# Patient Record
Sex: Male | Born: 1960 | Race: White | Marital: Married | Smoking: Never smoker
Health system: Southern US, Community
[De-identification: ages and names within clinical notes are randomized; demographics above are authoritative.]

## PROBLEM LIST (undated history)

## (undated) DIAGNOSIS — G43909 Migraine, unspecified, not intractable, without status migrainosus: Secondary | ICD-10-CM

## (undated) DIAGNOSIS — R51 Headache: Secondary | ICD-10-CM

## (undated) DIAGNOSIS — G4730 Sleep apnea, unspecified: Secondary | ICD-10-CM

## (undated) DIAGNOSIS — K219 Gastro-esophageal reflux disease without esophagitis: Secondary | ICD-10-CM

## (undated) DIAGNOSIS — M1990 Unspecified osteoarthritis, unspecified site: Secondary | ICD-10-CM

## (undated) DIAGNOSIS — E7800 Pure hypercholesterolemia, unspecified: Secondary | ICD-10-CM

## (undated) HISTORY — PX: TONSILLECTOMY: SUR1361

---

## 2002-03-19 HISTORY — PX: KNEE ARTHROSCOPY: SHX127

## 2010-01-18 HISTORY — PX: NASAL SEPTUM SURGERY: SHX37

## 2010-01-18 HISTORY — PX: ADENOIDECTOMY: SHX5191

## 2012-10-03 ENCOUNTER — Encounter (HOSPITAL_COMMUNITY): Payer: Self-pay | Admitting: *Deleted

## 2012-10-03 ENCOUNTER — Emergency Department (HOSPITAL_COMMUNITY)
Admission: EM | Admit: 2012-10-03 | Discharge: 2012-10-04 | Disposition: A | Discharge status: Home / Self Care | Attending: Emergency Medicine | Admitting: Emergency Medicine

## 2012-10-03 DIAGNOSIS — Z79899 Other long term (current) drug therapy: Secondary | ICD-10-CM | POA: Insufficient documentation

## 2012-10-03 DIAGNOSIS — Z8639 Personal history of other endocrine, nutritional and metabolic disease: Secondary | ICD-10-CM | POA: Insufficient documentation

## 2012-10-03 DIAGNOSIS — Z862 Personal history of diseases of the blood and blood-forming organs and certain disorders involving the immune mechanism: Secondary | ICD-10-CM | POA: Insufficient documentation

## 2012-10-03 DIAGNOSIS — H9202 Otalgia, left ear: Secondary | ICD-10-CM

## 2012-10-03 DIAGNOSIS — H9209 Otalgia, unspecified ear: Secondary | ICD-10-CM | POA: Insufficient documentation

## 2012-10-03 HISTORY — DX: Pure hypercholesterolemia, unspecified: E78.00

## 2012-10-03 NOTE — ED Notes (Signed)
Pt in c/o pain to left ear, states he has been seen for this already and dx with ear infection, states the symptoms are not getting any better, states he has been compliant with antibiotics

## 2012-10-03 NOTE — ED Notes (Signed)
ED PA at bedside

## 2012-10-04 MED ORDER — PSEUDOEPHEDRINE HCL ER 120 MG PO TB12
120.0000 mg | ORAL_TABLET | Freq: Once | ORAL | Status: AC
  Administered 2012-10-04: 120 mg via ORAL
  Filled 2012-10-04: qty 1

## 2012-10-04 MED ORDER — OXYCODONE-ACETAMINOPHEN 5-325 MG PO TABS: 1.0000 | ORAL_TABLET | ORAL | Status: DC | PRN

## 2012-10-04 NOTE — ED Provider Notes (Signed)
Medical screening examination/treatment/procedure(s) were performed by non-physician practitioner and as supervising physician I was immediately available for consultation/collaboration.   Dione Booze, MD 10/04/12 [phone]

## 2012-10-04 NOTE — ED Provider Notes (Signed)
CSN: [id number]     Arrival date & time 10/03/12  2114 History   First MD Initiated Contact with Patient 10/03/12 2151     Chief Complaint  Patient presents with  . Otalgia   (Consider location/radiation/quality/duration/timing/severity/associated sxs/prior Treatment) HPI History provided by pt.   Pt has had severe pain in L ear for the past 4 days.  Was evaluated at an urgent care the day after onset and was discharged home w/ clarithromycin, which he has been compliant with, and auralgan.  Neither auralgan or the vicodin he had at home have given him any relief.  Pain is radiating into neck and to entire L side of face.  No associated fever, sinus pressure, nasal congestion, sore throat or hearing impairment.  No PMH.  Past Medical History  Diagnosis Date  . High cholesterol    History reviewed. No pertinent past surgical history. History reviewed. No pertinent family history. History  Substance Use Topics  . Smoking status: Not on file  . Smokeless tobacco: Not on file  . Alcohol Use: Not on file    Review of Systems  All other systems reviewed and are negative.    Allergies  Review of patient's allergies indicates no known allergies.  Home Medications   Current Outpatient Rx  Name  Route  Sig  Dispense  Refill  . clarithromycin (BIAXIN) 500 MG tablet   Oral   Take 500 mg by mouth 2 (two) times daily. For 7 days.         Marland Kitchen esomeprazole (NEXIUM) 40 MG capsule   Oral   Take 40 mg by mouth at bedtime.         Marland Kitchen oxyCODONE-acetaminophen (PERCOCET/ROXICET) 5-325 MG per tablet   Oral   Take 1 tablet by mouth every 4 (four) hours as needed for pain.   15 tablet   0    BP 153/94  Pulse 95  Temp(Src) 98.5 F (36.9 C) (Oral)  Resp 20  Wt 180 lb (81.647 kg)  SpO2 100% Physical Exam  Nursing note and vitals reviewed. Constitutional: He is oriented to person, place, and time. He appears well-developed and well-nourished. No distress.  HENT:  Head: Normocephalic  and atraumatic.  No sinus ttp but tenderness L maxilla, tragus, pinna, mastoid and along eustachian tube. No Edema or erythema of L mastoid. Unremarkable L and R TM and EAC.  Mild erythema soft palate and posterior pharynx.  No exudate.  Eyes:  Normal appearance  Neck: Normal range of motion.  Pulmonary/Chest: Effort normal.  Musculoskeletal: Normal range of motion.  Neurological: He is alert and oriented to person, place, and time.  Psychiatric: He has a normal mood and affect. His behavior is normal.    ED Course  Procedures (including critical care time) Labs Review Labs Reviewed - No data to display Imaging Review No results found.  MDM   1. Otalgia of left ear    52yo "pre-diabetic" M presents w/ non-traumatic L ear pain x 4d.  He has been compliant w/ clarithromycin prescribed by urgent care provider 3 days ago.  No signs of OM/OE/mastoiditis/sinusits on exam.  There is tenderness along eustachian tube and I suspect that patient is congested.  Recommended that he continue the abx, start sudafed bid as well as low dose ibuprofen prn, and f/u w/ ENT if no improvement in 2 days.  Prescribed 15 percocet as well.  Pt has had no relief w/ hydrocodone at home, he appears very uncomfortable and I have no  suspicion for malingering.  Return precautions discussed.  12:08 AM     Otilio Miu, PA-C 10/04/12 [phone]

## 2012-10-04 NOTE — ED Notes (Signed)
Pt denies any questions upon discharge. 

## 2012-10-06 ENCOUNTER — Encounter (HOSPITAL_COMMUNITY): Payer: Self-pay | Admitting: Emergency Medicine

## 2012-10-06 ENCOUNTER — Emergency Department (HOSPITAL_COMMUNITY)
Admission: EM | Admit: 2012-10-06 | Discharge: 2012-10-06 | Disposition: A | Discharge status: Home / Self Care | Attending: Emergency Medicine | Admitting: Emergency Medicine

## 2012-10-06 DIAGNOSIS — Z792 Long term (current) use of antibiotics: Secondary | ICD-10-CM | POA: Insufficient documentation

## 2012-10-06 DIAGNOSIS — R209 Unspecified disturbances of skin sensation: Secondary | ICD-10-CM | POA: Insufficient documentation

## 2012-10-06 DIAGNOSIS — H9202 Otalgia, left ear: Secondary | ICD-10-CM

## 2012-10-06 DIAGNOSIS — Z8639 Personal history of other endocrine, nutritional and metabolic disease: Secondary | ICD-10-CM | POA: Insufficient documentation

## 2012-10-06 DIAGNOSIS — Z862 Personal history of diseases of the blood and blood-forming organs and certain disorders involving the immune mechanism: Secondary | ICD-10-CM | POA: Insufficient documentation

## 2012-10-06 DIAGNOSIS — H9209 Otalgia, unspecified ear: Secondary | ICD-10-CM | POA: Insufficient documentation

## 2012-10-06 DIAGNOSIS — H9210 Otorrhea, unspecified ear: Secondary | ICD-10-CM | POA: Insufficient documentation

## 2012-10-06 NOTE — ED Provider Notes (Signed)
Medical screening examination/treatment/procedure(s) were performed by non-physician practitioner and as supervising physician I was immediately available for consultation/collaboration.   Layla Maw Ward, DO 10/06/12 2333

## 2012-10-06 NOTE — ED Notes (Signed)
Pt. reports left ear ache onset Saturday , denies injury , slight brownish cerumen drainage , seen here 9/16 /2014 for the same complaint  prescribed with Percocet.

## 2012-10-06 NOTE — ED Provider Notes (Signed)
CSN: [id number]     Arrival date & time 10/06/12  2104 History  This chart was scribed for Frank Dredge, PA working with Frank Number, DO by [name], ED Scribe. This patient was seen in room TR05C/TR05C and the patient's care was started at 10:24 PM.  Chief Complaint  Patient presents with  . Otalgia    The history is provided by the patient. No language interpreter was used.    HPI Comments: Frank Baxter is a 52 y.o. male with h/o "pre-diabetes" and high cholesterol but no other chronic medical conditions who presents to the Emergency Department complaining of persistent severe left ear pain that began 6 days ago.  Pt was evaluated at an Urgent Care the day after onset and was discharged home with clarithromycin, which he has been taking without relief.  He states his pain continued to worsen and was not relieved by auralgan prescribed by the Urgent Care or Vicodin which he had at home.  3 days ago pt was evaluated in the ED and was advised to continue his antibiotics treatment and to start taking Sudafed 2x/day as well as ibuprofen as needed, and to follow up with ENT if not improved within 2 days.  He was prescribed 15 Percocet tablets as well. He states that he is still in "excruciating" pain that radiates into the left side of his face and neck.  The pain in his face has resolved somewhat but the pain inside his ear canal has not improved at all.  He also notes that he has a sensation of fullness in the ear and "sometimes feel like there's something moving around in there."  In addition he states that the left side of his face feels slightly numb.  Several nights ago he noticed a "glob" of dark red wax that came out of the ear.  He denies any other ear discharge.  Pt notes that 4 years ago he suffered a trauma to the ear drum in that ear but he denies any more recent injuries and states he did not have residual pain before this past week.  He denies exposure to loud noises.  He denies  changes in hearing, dental pain, fevers, chills, generalized myalgias, weakness or numbness in arms or legs, visual changes, tingling in face, sore throat, cough, SOB, or CP.  He has not followed up with ENT yet.   Past Medical History  Diagnosis Date  . High cholesterol     History reviewed. No pertinent past surgical history.   No family history on file.   History  Substance Use Topics  . Smoking status: Never Smoker   . Smokeless tobacco: Not on file  . Alcohol Use: No     Review of Systems  Constitutional: Negative for fever and chills.  HENT: Positive for ear pain and ear discharge. Negative for hearing loss, sore throat and dental problem.   Eyes: Negative for visual disturbance.  Respiratory: Negative for cough and shortness of breath.   Cardiovascular: Negative for chest pain.  Musculoskeletal: Negative for myalgias.  Neurological: Positive for numbness. Negative for weakness.     Allergies  Review of patient's allergies indicates no known allergies.  Home Medications   Current Outpatient Rx  Name  Route  Sig  Dispense  Refill  . clarithromycin (BIAXIN) 500 MG tablet   Oral   Take 500 mg by mouth 2 (two) times daily. For 7 days.         Marland Kitchen esomeprazole (NEXIUM) 40  MG capsule   Oral   Take 40 mg by mouth at bedtime.         Marland Kitchen oxyCODONE-acetaminophen (PERCOCET/ROXICET) 5-325 MG per tablet   Oral   Take 1 tablet by mouth every 4 (four) hours as needed for pain.   15 tablet   0    BP 151/84  Pulse 92  Temp(Src) 98.3 F (36.8 C) (Oral)  Resp 16  Ht 5\' 9"  (1.753 m)  Wt 180 lb (81.647 kg)  BMI 26.57 kg/m2  SpO2 98%  Physical Exam  Nursing note and vitals reviewed. Constitutional: He appears well-developed and well-nourished. No distress.  HENT:  Head: Atraumatic. Macrocephalic.  Right Ear: Tympanic membrane and ear canal normal. No lacerations. No swelling or tenderness. No foreign bodies. No mastoid tenderness. Tympanic membrane is not  injected, not scarred, not perforated, not erythematous, not retracted and not bulging.  Left Ear: Tympanic membrane and ear canal normal. No lacerations. No drainage, swelling or tenderness. No foreign bodies. No mastoid tenderness. Tympanic membrane is not injected, not scarred, not perforated, not erythematous, not retracted and not bulging. No hemotympanum.  Mouth/Throat: Oropharynx is clear and moist. No oropharyngeal exudate, posterior oropharyngeal edema or posterior oropharyngeal erythema.  Normal-appearing brown cerumen at edge of left ear canal, canal otherwise normal. No pain with traction on the pinna or pressure on the tragus No mastoid tenderness  Neck: Neck supple.  Pulmonary/Chest: Effort normal.  Lymphadenopathy:       Head (right side): No preauricular adenopathy present.       Head (left side): No preauricular adenopathy present.       Right cervical: No posterior cervical adenopathy present.      Left cervical: No posterior cervical adenopathy present.  No preauricular lymphadenopathy No posterior occipital lymphadenopathy No posterior cervical or anterior cervical lymphadenopathy  Neurological: He is alert. He has normal strength. No cranial nerve deficit or sensory deficit. GCS eye subscore is 4. GCS verbal subscore is 5. GCS motor subscore is 6.  CN II-XII intact, EOMs intact, no pronator drift, grip strengths equal bilaterally; strength 5/5 in all extremities (4/5 with extension of right knee, chronic unchanged), sensation intact in all extremities; finger to nose, heel to shin, rapid alternating movements normal; gait is normal (with limp from chronic right knee pain).     Skin: He is not diaphoretic.    ED Course  Procedures (including critical care time)  DIAGNOSTIC STUDIES: Oxygen Saturation is 98% on room air, normal by my interpretation.    COORDINATION OF CARE: 10:38 PM-Discussed treatment plan which includes referral to ENT with pt at bedside and pt agreed  to plan.    Labs Review Labs Reviewed - No data to display  Imaging Review No results found.  10:40 PM Discussed patient with Dr Elesa Massed, specifically regarding any imaging that may be of use.  Given patient's normal exam and acute onset of pain, unlikely to be helpful at this point.  Will d/c with close ENT follow up.  Pt declines further pain medication as nothing is working at home.  Pt to continue antibiotics and sudafed.   MDM   1. Pain in left ear    Patient with pain in left ear unrelieved with auralgan, OTC pain meds, vicodin, percocet, sudafed.  He continues on clarithromycin.  He does not have mastoid tenderness nor lymphadenopathy or dental pain.  He has no s/s of OM or OE.  He is afebrile, nontoxic.  I do not know the etiology of  his pain.  His exam is very normal.  Pt referred at last visit to ENT but pt states he was unaware - I have referred him to the same ENT and have urged close follow up.  Discussed findings and follow up  with patient.  Pt given return precautions.  Pt verbalizes understanding and agrees with plan.      I personally performed the services described in this documentation, which was scribed in my presence. The recorded information has been reviewed and is accurate.    Frank Dredge, PA-C 10/06/12 2323

## 2013-02-27 ENCOUNTER — Emergency Department (HOSPITAL_COMMUNITY)

## 2013-02-27 ENCOUNTER — Observation Stay (HOSPITAL_COMMUNITY)

## 2013-02-27 ENCOUNTER — Observation Stay (HOSPITAL_COMMUNITY)
Admission: EM | Admit: 2013-02-27 | Discharge: 2013-02-28 | Disposition: A | Discharge status: Home / Self Care | Attending: Family Medicine | Admitting: Family Medicine

## 2013-02-27 ENCOUNTER — Encounter (HOSPITAL_COMMUNITY): Payer: Self-pay | Admitting: Emergency Medicine

## 2013-02-27 DIAGNOSIS — R0789 Other chest pain: Principal | ICD-10-CM | POA: Insufficient documentation

## 2013-02-27 DIAGNOSIS — G43909 Migraine, unspecified, not intractable, without status migrainosus: Secondary | ICD-10-CM | POA: Diagnosis present

## 2013-02-27 DIAGNOSIS — R209 Unspecified disturbances of skin sensation: Secondary | ICD-10-CM | POA: Insufficient documentation

## 2013-02-27 DIAGNOSIS — R079 Chest pain, unspecified: Secondary | ICD-10-CM

## 2013-02-27 DIAGNOSIS — M47812 Spondylosis without myelopathy or radiculopathy, cervical region: Secondary | ICD-10-CM

## 2013-02-27 DIAGNOSIS — M129 Arthropathy, unspecified: Secondary | ICD-10-CM | POA: Insufficient documentation

## 2013-02-27 DIAGNOSIS — M5412 Radiculopathy, cervical region: Secondary | ICD-10-CM

## 2013-02-27 DIAGNOSIS — Z79899 Other long term (current) drug therapy: Secondary | ICD-10-CM | POA: Insufficient documentation

## 2013-02-27 DIAGNOSIS — E7800 Pure hypercholesterolemia, unspecified: Secondary | ICD-10-CM | POA: Insufficient documentation

## 2013-02-27 DIAGNOSIS — M1990 Unspecified osteoarthritis, unspecified site: Secondary | ICD-10-CM

## 2013-02-27 DIAGNOSIS — G4730 Sleep apnea, unspecified: Secondary | ICD-10-CM

## 2013-02-27 DIAGNOSIS — K219 Gastro-esophageal reflux disease without esophagitis: Secondary | ICD-10-CM | POA: Diagnosis present

## 2013-02-27 HISTORY — DX: Unspecified osteoarthritis, unspecified site: M19.90

## 2013-02-27 HISTORY — DX: Gastro-esophageal reflux disease without esophagitis: K21.9

## 2013-02-27 HISTORY — DX: Migraine, unspecified, not intractable, without status migrainosus: G43.909

## 2013-02-27 HISTORY — DX: Headache: R51

## 2013-02-27 HISTORY — DX: Sleep apnea, unspecified: G47.30

## 2013-02-27 LAB — CBC
HCT: 44.3 % (ref 39.0–52.0)
HCT: 44.3 % (ref 39.0–52.0)
Hemoglobin: 15.5 g/dL (ref 13.0–17.0)
Hemoglobin: 15.4 g/dL (ref 13.0–17.0)
MCH: 30.2 pg (ref 26.0–34.0)
MCH: 29.9 pg (ref 26.0–34.0)
MCHC: 35 g/dL (ref 30.0–36.0)
MCHC: 34.8 g/dL (ref 30.0–36.0)
MCV: 86.2 fL (ref 78.0–100.0)
MCV: 86 fL (ref 78.0–100.0)
PLATELETS: 198 10*3/uL (ref 150–400)
PLATELETS: 185 10*3/uL (ref 150–400)
RBC: 5.14 MIL/uL (ref 4.22–5.81)
RBC: 5.15 MIL/uL (ref 4.22–5.81)
RDW: 13.6 % (ref 11.5–15.5)
RDW: 13.8 % (ref 11.5–15.5)
WBC: 6.3 10*3/uL (ref 4.0–10.5)
WBC: 5.8 10*3/uL (ref 4.0–10.5)

## 2013-02-27 LAB — BASIC METABOLIC PANEL
BUN: 17 mg/dL (ref 6–23)
CHLORIDE: 101 meq/L (ref 96–112)
CO2: 24 meq/L (ref 19–32)
CREATININE: 1.24 mg/dL (ref 0.50–1.35)
Calcium: 9.3 mg/dL (ref 8.4–10.5)
GFR calc non Af Amer: 66 mL/min — ABNORMAL LOW (ref >90)
GFR, EST AFRICAN AMERICAN: 76 mL/min — AB (ref >90)
Glucose, Bld: 113 mg/dL — ABNORMAL HIGH (ref 70–99)
POTASSIUM: 3.9 meq/L (ref 3.7–5.3)
SODIUM: 139 meq/L (ref 137–147)

## 2013-02-27 LAB — CREATININE, SERUM
CREATININE: 1.21 mg/dL (ref 0.50–1.35)
GFR calc Af Amer: 79 mL/min — ABNORMAL LOW (ref >90)
GFR calc non Af Amer: 68 mL/min — ABNORMAL LOW (ref >90)

## 2013-02-27 LAB — TROPONIN I: Troponin I: <0.3 ng/mL (ref <0.30)

## 2013-02-27 LAB — POCT I-STAT TROPONIN I: TROPONIN I, POC: 0.01 ng/mL (ref 0.00–0.08)

## 2013-02-27 MED ORDER — MORPHINE SULFATE 2 MG/ML IJ SOLN: 1.0000 mg | INTRAMUSCULAR | Status: DC | PRN

## 2013-02-27 MED ORDER — SODIUM CHLORIDE 0.9 % IV SOLN
INTRAVENOUS | Status: DC
  Administered 2013-02-27: 23:00:00 via INTRAVENOUS

## 2013-02-27 MED ORDER — SODIUM CHLORIDE 0.9 % IJ SOLN: 3.0000 mL | Freq: Two times a day (BID) | INTRAMUSCULAR | Status: DC

## 2013-02-27 MED ORDER — HEPARIN SODIUM (PORCINE) 5000 UNIT/ML IJ SOLN
5000.0000 [IU] | Freq: Three times a day (TID) | INTRAMUSCULAR | Status: DC
  Filled 2013-02-27 (×4): qty 1

## 2013-02-27 MED ORDER — PANTOPRAZOLE SODIUM 40 MG PO TBEC
80.0000 mg | DELAYED_RELEASE_TABLET | Freq: Every day | ORAL | Status: DC
  Administered 2013-02-28: 80 mg via ORAL
  Filled 2013-02-27: qty 2

## 2013-02-27 NOTE — ED Provider Notes (Signed)
Medical screening examination/treatment/procedure(s) were conducted as a shared visit with non-physician practitioner(s) and myself.  I personally evaluated the patient during the encounter.  EKG Interpretation    Date/Time:  Tuesday February 27 2013 19:05:34 EST Ventricular Rate:  79 PR Interval:  130 QRS Duration: 94 QT Interval:  360 QTC Calculation: 412 R Axis:   69 Text Interpretation:  Normal sinus rhythm Nonspecific ST and T wave abnormality Abnormal ECG No significant change since last tracing one minute earlier Confirmed by Sholanda Croson  MD-J, Dameion Briles ([id number]) on 02/27/2013 7:29:29 PM            Pt with symptoms concerning for possible unstable angina.  Pt has some nausea with with his symptoms.  Non specific st changes but do not appear normal.  With his strong family history of the MI and his EKG findings will admit for further evaluation.  Celene KrasJon R Zanyia Silbaugh, MD 02/27/13 2032

## 2013-02-27 NOTE — ED Provider Notes (Signed)
CSN: [id number]     Arrival date & time 02/27/13  1849 History   First MD Initiated Contact with Patient 02/27/13 1923     Chief Complaint  Patient presents with  . Chest Pain    HPI  Frank Baxter is a 53 y.o. male with a PMH of high cholesterol who presents to the ED for evaluation of chest pain.  History was provided by the patient. Patient states he has had intermittent chest pain over the past 3 days. His pain is located in the left shoulder with radiation down his left arm and his left ribs. He states that his pain comes and goes randomly and does not seem to be associated with movement. He describes a sharp stabbing pain "in my bones."  He does report worsening symptoms with his arm across his body. He tried taking a Ibuprofen and muscle relaxer with no relief. He has numbness and tingling in the left hand. No weakness, loss of sensation. No injuries or trauma. He took one is his wife's percocet with some relief. He denies any similar pain in the past. He denies any SOB, dyspnea, wheezing, back pain, flank pain, or abdominal pain. He states he also is intermittently getting lightheaded and nauseated but denies any of these symptoms currently. No fevers. He denies any syncope. MI in dad at age 79. No hx of tobacco use.    Past Medical History  Diagnosis Date  . High cholesterol    History reviewed. No pertinent past surgical history. History reviewed. No pertinent family history. History  Substance Use Topics  . Smoking status: Never Smoker   . Smokeless tobacco: Not on file  . Alcohol Use: No    Review of Systems  Constitutional: Negative for fever, chills, diaphoresis, activity change, appetite change and fatigue.  HENT: Negative for congestion, rhinorrhea and sore throat.   Eyes: Negative for photophobia and visual disturbance.  Respiratory: Negative for cough and shortness of breath.   Cardiovascular: Positive for chest pain. Negative for leg swelling.  Gastrointestinal:  Positive for nausea. Negative for vomiting, abdominal pain, diarrhea and constipation.  Genitourinary: Negative for dysuria.  Musculoskeletal: Negative for back pain, myalgias and neck pain.  Skin: Negative for wound.  Neurological: Positive for light-headedness and numbness. Negative for dizziness, syncope, weakness and headaches.    Allergies  Review of patient's allergies indicates no known allergies.  Home Medications   Current Outpatient Rx  Name  Route  Sig  Dispense  Refill  . clarithromycin (BIAXIN) 500 MG tablet   Oral   Take 500 mg by mouth 2 (two) times daily. For 7 days.         Marland Kitchen esomeprazole (NEXIUM) 40 MG capsule   Oral   Take 40 mg by mouth at bedtime.         Marland Kitchen oxyCODONE-acetaminophen (PERCOCET/ROXICET) 5-325 MG per tablet   Oral   Take 1 tablet by mouth every 4 (four) hours as needed for pain.   15 tablet   0    BP 155/84  Pulse 85  Temp(Src) 98.3 F (36.8 C)  Resp 18  SpO2 98%  Filed Vitals:   02/27/13 1913 02/27/13 2009  BP: 155/84 133/82  Pulse: 85 72  Temp: 98.3 F (36.8 C)   Resp: 18   SpO2: 98% 95%    Physical Exam  Nursing note and vitals reviewed. Constitutional: He is oriented to person, place, and time. He appears well-developed and well-nourished. No distress.  HENT:  Head: Normocephalic  and atraumatic.  Right Ear: External ear normal.  Left Ear: External ear normal.  Nose: Nose normal.  Mouth/Throat: Oropharynx is clear and moist.  Eyes: Conjunctivae are normal. Right eye exhibits no discharge. Left eye exhibits no discharge.  Neck: Normal range of motion. Neck supple.  No cervical spinal or paraspinal tenderness to palpation throughout.  No limitations with neck ROM.    Cardiovascular: Normal rate, regular rhythm, normal heart sounds and intact distal pulses.  Exam reveals no gallop and no friction rub.   No murmur heard. Radial and dorsalis pedis pulses present and equal bilaterally  Pulmonary/Chest: Effort normal and  breath sounds normal. No respiratory distress. He has no wheezes. He has no rales. He exhibits no tenderness.  Abdominal: Soft. He exhibits no distension. There is no tenderness.  Musculoskeletal: Normal range of motion. He exhibits no edema and no tenderness.  No tenderness to palpation to the left arm throughout. No increased pain with ROM. Strength 5/5 in the upper extremities bilaterally. Patient able to ambulate without difficulty or ataxia. No LE edema or calf tenderness bilaterally  Neurological: He is alert and oriented to person, place, and time.  Sensation intact in the UE bilaterally  Skin: Skin is warm and dry. He is not diaphoretic.  No ecchymosis, edema, erythema or lacerations to the UE's bilaterally    ED Course  Procedures (including critical care time) Labs Review Labs Reviewed  CBC  BASIC METABOLIC PANEL   Imaging Review No results found.  EKG Interpretation    Date/Time:  Tuesday February 27 2013 19:05:34 EST Ventricular Rate:  79 PR Interval:  130 QRS Duration: 94 QT Interval:  360 QTC Calculation: 412 R Axis:   69 Text Interpretation:  Normal sinus rhythm Nonspecific ST and T wave abnormality Abnormal ECG No significant change since last tracing one minute earlier Confirmed by KNAPP  MD-J, JON ([id number]) on 02/27/2013 7:29:29 PM            DG Chest 2 View (Final result)  Result time: 02/27/13 19:48:32    Final result by Rad Results In Interface (02/27/13 19:48:32)    Narrative:   CLINICAL DATA: Pain in left arm and left side for 3 days. Dizziness. Nausea for 3 days.  EXAM: CHEST 2 VIEW  COMPARISON: None.  FINDINGS: Heart size is normal. The lungs are clear. There are no focal consolidations or pleural effusions. No pulmonary edema. Visualized osseous structures have a normal appearance.  IMPRESSION: No active cardiopulmonary disease.   Electronically Signed By: Rosalie Gums M.D. On: 02/27/2013 19:48        Results for orders placed  during the hospital encounter of 02/27/13  CBC      Result Value Range   WBC 6.3  4.0 - 10.5 K/uL   RBC 5.14  4.22 - 5.81 MIL/uL   Hemoglobin 15.5  13.0 - 17.0 g/dL   HCT 45.4  09.8 - 11.9 %   MCV 86.2  78.0 - 100.0 fL   MCH 30.2  26.0 - 34.0 pg   MCHC 35.0  30.0 - 36.0 g/dL   RDW 14.7  82.9 - 56.2 %   Platelets 198  150 - 400 K/uL  POCT I-STAT TROPONIN I      Result Value Range   Troponin i, poc 0.01  0.00 - 0.08 ng/mL   Comment 3            BASIC METABOLIC PANEL      Result Value Ref Range   Sodium  139  137 - 147 mEq/L   Potassium 3.9  3.7 - 5.3 mEq/L   Chloride 101  96 - 112 mEq/L   CO2 24  19 - 32 mEq/L   Glucose, Bld 113 (*) 70 - 99 mg/dL   BUN 17  6 - 23 mg/dL   Creatinine, Ser 1.611.24  0.50 - 1.35 mg/dL   Calcium 9.3  8.4 - 09.610.5 mg/dL   GFR calc non Af Amer 66 (*) >90 mL/min   GFR calc Af Amer 76 (*) >90 mL/min    MDM   Frank Baxter is a 53 y.o. male with a PMH of HLD who presents to the ED for evaluation of chest pain.   Consults  8:15 PM = Spoke with Dr. Alvester MorinNewton who will evaluate the patient.  9:00 PM = Dr. Alvester MorinNewton in ED evaluating the patient.     Patient evaluated for further evaluation of his chest pain.  Patient complains of radiating left arm pain x 3 days with associated nausea and lightheadedness. Patient has a FH of cardiac disease as well as high cholesterol. EKG showed diffuse ST segment changes. Chest x-ray negative for an acute cardiopulmonary process. Troponin negative x 1. Patient in agreement with admission and plan.    Final impressions: 1. Chest pain       Luiz IronJessica Katlin Kannan Proia PA-C   This patient was discussed with Dr. Abbe AmsterdamKnapp          Britlee Skolnik K Elis Sauber, PA-C 02/28/13 [phone]

## 2013-02-27 NOTE — ED Notes (Signed)
Per pt sts 3 days of left sided chest pain and back pain. sts the pain has been intermittent with some nausea and light headed.

## 2013-02-27 NOTE — H&P (Signed)
Hospitalist Admission History and Physical  Patient name: Frank Baxter Medical record number: [id number] Date of birth: 04/22/1960 Age: 53 y.o. Gender: male  Primary Care ProvLucy Chrisider: No PCP Per Patient  Chief Complaint: L arm pain  History of Present Illness:This is a 53 y.o. year old male with prior hx/o HLD presenting with L arm pain. Pt states that he has had persistent L arm pain over the past 3 days. Pain has been sharp in L arm and variable in intensity from 1-10. Pt does reports subsequent nausea with severe pain in L arm. Denies any recent trauma. Has had some intermittent paresthesias/radicular sxs into L arm. Pain does keep pt up at night. No true chest pain. No recent infections, fevers, chills. Has had some intermittent dizziness with severe pain. No epigastric pain. Has been complaint with PPI. No SOB. Pt recently [facility] ? [facility] [place]. Had preliminary blood work. PCP follow up scheduled for next week per pt.   In the ER, trop, CBC, CMET all WNL. EKG w/ NSR w/nonspecific t wave changes. CXR WNL.   Cardiovascular risk factors include: HLD, + family hx/o MI in father (MI @ 3851) as well as uncle who died from CV disease. + mom with stroke.    Patient Active Problem List   Diagnosis Date Noted  . Chest pain 02/27/2013   Past Medical History: Past Medical History  Diagnosis Date  . High cholesterol     Past Surgical History: History reviewed. No pertinent past surgical history.  Social History: History   Social History  . Marital Status: Married    Spouse Name: N/A    Number of Children: N/A  . Years of Education: N/A   Social History Main Topics  . Smoking status: Never Smoker   . Smokeless tobacco: None  . Alcohol Use: No  . Drug Use: No  . Sexual Activity: None   Other Topics Concern  . None   Social History Narrative  . None    Family History: History reviewed. No pertinent family history.  Allergies: No Known Allergies  Current  Facility-Administered Medications  Medication Dose Route Frequency Provider Last Rate Last Dose  . 0.9 %  sodium chloride infusion   Intravenous Continuous Doree AlbeeSteven Margarit Minshall, MD      . heparin injection 5,000 Units  5,000 Units Subcutaneous Q8H Doree AlbeeSteven Johnnisha Forton, MD      . morphine 2 MG/ML injection 1 mg  1 mg Intravenous Q2H PRN Doree AlbeeSteven Cornie Mccomber, MD      . Melene Muller[START ON 02/28/2013] pantoprazole (PROTONIX) EC tablet 80 mg  80 mg Oral Q1200 Doree AlbeeSteven Bentlee Benningfield, MD      . sodium chloride 0.9 % injection 3 mL  3 mL Intravenous Q12H Doree AlbeeSteven Zephaniah Lubrano, MD       Current Outpatient Prescriptions  Medication Sig Dispense Refill  . celecoxib (CELEBREX) 200 MG capsule Take 200 mg by mouth daily with breakfast.      . esomeprazole (NEXIUM) 40 MG capsule Take 40 mg by mouth at bedtime.      Marland Kitchen. ibuprofen (ADVIL,MOTRIN) 200 MG tablet Take 800 mg by mouth daily as needed (pain).       Review Of Systems: 12 point ROS negative except as noted above in HPI.  Physical Exam: Filed Vitals:   02/27/13 2009  BP: 133/82  Pulse: 72  Temp:   Resp:     General: alert and cooperative HEENT: PERRLA and extra ocular movement intact Heart: S1, S2 normal, no murmur, rub or  gallop, regular rate and rhythm, no anterior chest wall TTP  Lungs: clear to auscultation, no wheezes or rales and unlabored breathing Abdomen: abdomen is soft without significant tenderness, masses, organomegaly or guarding, no epigastric tenderness.  Extremities: extremities normal, atraumatic, no cyanosis or edema, L arm full ROM, neurovascularly intact distally, Spurlings negative. Empty can negative.  Skin:no rashes Neurology: normal without focal findings  Labs and Imaging: Lab Results  Component Value Date/Time   NA 139 02/27/2013  7:14 PM   K 3.9 02/27/2013  7:14 PM   CL 101 02/27/2013  7:14 PM   CO2 24 02/27/2013  7:14 PM   BUN 17 02/27/2013  7:14 PM   CREATININE 1.24 02/27/2013  7:14 PM   GLUCOSE 113* 02/27/2013  7:14 PM   Lab Results  Component Value Date    WBC 6.3 02/27/2013   HGB 15.5 02/27/2013   HCT 44.3 02/27/2013   MCV 86.2 02/27/2013   PLT 198 02/27/2013   EKG: NSR    Dg Chest 2 View  02/27/2013   CLINICAL DATA:  Pain in left arm and left side for 3 days. Dizziness. Nausea for 3 days.  EXAM: CHEST  2 VIEW  COMPARISON:  None.  FINDINGS: Heart size is normal. The lungs are clear. There are no focal consolidations or pleural effusions. No pulmonary edema. Visualized osseous structures have a normal appearance.  IMPRESSION: No active cardiopulmonary disease.   Electronically Signed   By: Rosalie Gums M.D.   On: 02/27/2013 19:48     Assessment and Plan: Frank Baxter is a 53 y.o. year old male presenting with L arm pain   L arm pain: Somewhat atypical pain for ACS in pt with CV risk factors. Will cycle CEs. Risk stratification labs including A1C, TSH, lipid panel. Grossly negative MSK exam. Will obtain c spine and L shoulder xrays to assess for any intra-articular disease. Will likely benefit from outpt stress test. Prn morphine for pain.    FEN/GI: Heart healthy diet. PPI  Prophylaxis: sub q heparin  Disposition: pending further evaluation  Code Status:full code        Doree Albee MD  Pager: [phone]

## 2013-02-27 NOTE — ED Notes (Signed)
Patient transported to X-ray 

## 2013-02-28 ENCOUNTER — Encounter (HOSPITAL_COMMUNITY): Payer: Self-pay | Admitting: General Practice

## 2013-02-28 DIAGNOSIS — G43909 Migraine, unspecified, not intractable, without status migrainosus: Secondary | ICD-10-CM | POA: Diagnosis present

## 2013-02-28 DIAGNOSIS — M47812 Spondylosis without myelopathy or radiculopathy, cervical region: Secondary | ICD-10-CM

## 2013-02-28 DIAGNOSIS — M1990 Unspecified osteoarthritis, unspecified site: Secondary | ICD-10-CM

## 2013-02-28 DIAGNOSIS — M129 Arthropathy, unspecified: Secondary | ICD-10-CM

## 2013-02-28 DIAGNOSIS — M5412 Radiculopathy, cervical region: Secondary | ICD-10-CM

## 2013-02-28 DIAGNOSIS — G4730 Sleep apnea, unspecified: Secondary | ICD-10-CM

## 2013-02-28 DIAGNOSIS — K219 Gastro-esophageal reflux disease without esophagitis: Secondary | ICD-10-CM

## 2013-02-28 DIAGNOSIS — E7800 Pure hypercholesterolemia, unspecified: Secondary | ICD-10-CM

## 2013-02-28 LAB — CBC
HCT: 45.3 % (ref 39.0–52.0)
HEMOGLOBIN: 15.5 g/dL (ref 13.0–17.0)
MCH: 29.8 pg (ref 26.0–34.0)
MCHC: 34.2 g/dL (ref 30.0–36.0)
MCV: 86.9 fL (ref 78.0–100.0)
Platelets: 198 10*3/uL (ref 150–400)
RBC: 5.21 MIL/uL (ref 4.22–5.81)
RDW: 13.7 % (ref 11.5–15.5)
WBC: 5.6 10*3/uL (ref 4.0–10.5)

## 2013-02-28 LAB — COMPREHENSIVE METABOLIC PANEL
ALK PHOS: 60 U/L (ref 39–117)
ALT: 33 U/L (ref 0–53)
AST: 28 U/L (ref 0–37)
Albumin: 3.6 g/dL (ref 3.5–5.2)
BUN: 18 mg/dL (ref 6–23)
CO2: 24 mEq/L (ref 19–32)
Calcium: 8.7 mg/dL (ref 8.4–10.5)
Chloride: 103 mEq/L (ref 96–112)
Creatinine, Ser: 1.22 mg/dL (ref 0.50–1.35)
GFR calc Af Amer: 78 mL/min — ABNORMAL LOW (ref >90)
GFR calc non Af Amer: 67 mL/min — ABNORMAL LOW (ref >90)
GLUCOSE: 106 mg/dL — AB (ref 70–99)
POTASSIUM: 4.3 meq/L (ref 3.7–5.3)
SODIUM: 141 meq/L (ref 137–147)
TOTAL PROTEIN: 6.5 g/dL (ref 6.0–8.3)
Total Bilirubin: 0.5 mg/dL (ref 0.3–1.2)

## 2013-02-28 LAB — TROPONIN I
Troponin I: <0.3 ng/mL (ref <0.30)
Troponin I: <0.3 ng/mL (ref <0.30)

## 2013-02-28 LAB — HEMOGLOBIN A1C
HEMOGLOBIN A1C: 6 % — AB (ref <5.7)
MEAN PLASMA GLUCOSE: 126 mg/dL — AB (ref <117)

## 2013-02-28 LAB — TSH: TSH: 2.271 u[IU]/mL (ref 0.350–4.500)

## 2013-02-28 MED ORDER — INFLUENZA VAC SPLIT QUAD 0.5 ML IM SUSP
0.5000 mL | Freq: Once | INTRAMUSCULAR | Status: DC
  Filled 2013-02-28: qty 0.5

## 2013-02-28 NOTE — Progress Notes (Signed)
Pt provided with dc instructions and education. Pt verbalized understanding. Pt has no questions at this time. Iv removed with tip intact. Heart monitor cleaned and returned to front. Levonne Spillerhasidy Deolinda Frid, RN

## 2013-02-28 NOTE — Discharge Summary (Addendum)
Physician Discharge Summary  Frank Baxter DOB: [birth year] DOA: 02/27/2013  PCP: No PCP Per Patient  Admit date: 02/27/2013 Discharge date: 02/28/2013  Recommendations for Outpatient Follow-up:  Follow up with PCP in 2 weeks.  If pain persisting, consider referral to neurology for possible cervical injections.  Primary care doctor to followup on pending hemoglobin A1c and TSH.    Discharge Diagnoses:  Principal Problem:   High cholesterol Active Problems:   Arthritis   Sleep apnea   GERD (gastroesophageal reflux disease)   Migraine   DJD (degenerative joint disease) of cervical spine   Cervical neuropathic pain   Discharge Condition: Stable, improved  Diet recommendation: Healthy heart  Wt Readings from Last 3 Encounters:  02/28/13 81.799 kg (180 lb 5.4 oz)  10/06/12 81.647 kg (180 lb)  10/03/12 81.647 kg (180 lb)    History of present illness:  History of Present Illness:This is a 53 y.o. year old male with prior hx/o HLD presenting with L arm pain. Pt states that he has had persistent L arm pain over the past 3 days. Pain has been sharp in L arm and variable in intensity from 1-10. Pt does reports subsequent nausea with severe pain in L arm. Denies any recent trauma. Has had some intermittent paresthesias/radicular sxs into L arm. Pain does keep pt up at night. No true chest pain. No recent infections, fevers, chills. Has had some intermittent dizziness with severe pain. No epigastric pain. Has been complaint with PPI. No SOB. Pt [facility] [place]. Had preliminary blood work. PCP follow up scheduled for next week per pt.  In the ER, trop, CBC, CMET all WNL. EKG w/ NSR w/nonspecific t wave changes. CXR WNL.  Cardiovascular risk factors include: HLD, + family hx/o MI in father (MI @ 2951) as well as uncle who died from CV disease. + mom with stroke.    Hospital Course:   Atypical left arm pain: Although this patient has risk factors  for cardiovascular disease, his chest pain is atypical by history give location. His pain is recreated by looking to the left and to the right and by positioning of the left arm, and his x-rays of the cervical spine demonstrate DJD. 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  Most likely his pain is related to DJD of the cervical spine with some cervical nerve root impingement. He is advised to continue his Celebrex with ibuprofen as needed.  Continue PPI.  If his pain is not improving, he should followup with his primary care doctor for referral to neurology for possible nerve root injection.  Given his family history of early heart disease, he is advised to call 911 if he turns his chest pressure with shortness of breath, nausea, diaphoresis, or other concerning symptoms for heart attack.  Primary care doctor to followup on pending hemoglobin A1c and TSH.  GERD, stable, continue PPI  OSA, stable.  Continue CPAP  HLD, stable.  Defer to PCP.  Procedures:  CXR  Left shoulder XR  Cervical spine XR  Consultations:  None  Discharge Exam: Filed Vitals:   02/28/13 0445  BP: 137/82  Pulse: 67  Temp: 97.2 F (36.2 C)  Resp: 18   Filed Vitals:   02/27/13 2009 02/27/13 2100 02/27/13 2230 02/28/13 0445  BP: 133/82 124/91 155/86 137/82  Pulse: 72 71 66 67  Temp:   98.4 F (36.9 C) 97.2 F (36.2 C)  TempSrc:   Oral Oral  Resp:   18 18  Height:   5\' 9"  (1.753 m)   Weight:   81.829 kg (180 lb 6.4 oz) 81.799 kg (180 lb 5.4 oz)  SpO2: 95% 93% 97% 96%    General: White male, NAD HEENT:  NCAT, MMM Cardiovascular: RRR, no mrg, 2+ pulses Respiratory:  CTAB ABD:  NABS, soft, NT/ND MSK:  No LEE, no TTP of cervical spine or paraspinous muscles, FROM of left shoulder, elbow, wrist.  Pain radiates down left arm when looking to the right and to the left.  No LEE NEURO:  CN II-XII grossly intact, strength 5/5 throughout, sensation intact to light touch bilateral arms  Discharge  Instructions      Discharge Orders   Future Orders Complete By Expires   Call MD for:  difficulty breathing, headache or visual disturbances  As directed    Call MD for:  extreme fatigue  As directed    Call MD for:  hives  As directed    Call MD for:  persistant dizziness or light-headedness  As directed    Call MD for:  persistant nausea and vomiting  As directed    Call MD for:  severe uncontrolled pain  As directed    Call MD for:  temperature >100.4  As directed    Diet - low sodium heart healthy  As directed    Discharge instructions  As directed    Comments:     You were hospitalized with left arm and left chest pain.  Most likely, your pain is due to cervical spine arthritis causing some pinching of the nerves of your neck which run down your arm.  Please continue your celebrex.  You may use tylenol with either naprosyn or ibuprofen for breakthrough pain.  Please read through the information about cervical spine problems carefully and try using massage and cold compresses.  See your primary care doctor in 1-2 weeks and if your pain is not improving, consider referral to neurology for cervical spine injection.  Please continue nexium to protect your stomach while taking celebrex and other over the counter pain medication.  If you have chest pressure with shortness of breath, sweating, or any other concerning symptoms, please call 911 immediately.   Increase activity slowly  As directed        Medication List         celecoxib 200 MG capsule  Commonly known as:  CELEBREX  Take 200 mg by mouth daily with breakfast.     esomeprazole 40 MG capsule  Commonly known as:  NEXIUM  Take 40 mg by mouth at bedtime.     ibuprofen 200 MG tablet  Commonly known as:  ADVIL,MOTRIN  Take 800 mg by mouth daily as needed (pain).       Follow-up Information   Follow up with No PCP Per Patient. Schedule an appointment as soon as possible for a visit in 2 weeks.   Specialty:  General Practice    Contact information:   [address] [place] [address] [phone]        The results of significant diagnostics from this hospitalization (including imaging, microbiology, ancillary and laboratory) are listed below for reference.    Significant Diagnostic Studies: Dg Chest 2 View  02/27/2013   CLINICAL DATA:  Pain in left arm and left side for 3 days. Dizziness. Nausea for 3 days.  EXAM: CHEST  2 VIEW  COMPARISON:  None.  FINDINGS: Heart size is normal. The lungs  are clear. There are no focal consolidations or pleural effusions. No pulmonary edema. Visualized osseous structures have a normal appearance.  IMPRESSION: No active cardiopulmonary disease.   Electronically Signed   By: Rosalie Gums M.D.   On: 02/27/2013 19:48   Dg Cervical Spine Complete  02/27/2013   CLINICAL DATA:  Neck pain  EXAM: CERVICAL SPINE  4+ VIEWS  COMPARISON:  None.  FINDINGS: There is no evidence of cervical spine fracture or prevertebral soft tissue swelling. Alignment is normal. There is narrowing of the right C4-5, C5-6, left C2-3, C3-4 C4-5, C6-7 neural foramina due to osteophyte encroachment. There is anterior osteophytosis at C6.  IMPRESSION: No acute fracture or dislocation. Degenerative joint changes of cervical spine.   Electronically Signed   By: Sherian Rein M.D.   On: 02/27/2013 21:41   Dg Shoulder Left  02/27/2013   CLINICAL DATA:  Pain in the left arm  EXAM: LEFT SHOULDER - 2+ VIEW  COMPARISON:  None.  FINDINGS: There is no evidence of fracture or dislocation. There is no evidence of arthropathy or other focal bone abnormality. Soft tissues are unremarkable.  IMPRESSION: No acute fracture or dislocation.   Electronically Signed   By: Sherian Rein M.D.   On: 02/27/2013 21:42    Microbiology: No results found for this or any previous visit (from the past 240 hour(s)).   Labs: Basic Metabolic Panel:  Recent Labs Lab 02/27/13 1914 02/27/13 2153 02/28/13 0410  NA 139  --  141  K 3.9  --   4.3  CL 101  --  103  CO2 24  --  24  GLUCOSE 113*  --  106*  BUN 17  --  18  CREATININE 1.24 1.21 1.22  CALCIUM 9.3  --  8.7   Liver Function Tests:  Recent Labs Lab 02/28/13 0410  AST 28  ALT 33  ALKPHOS 60  BILITOT 0.5  PROT 6.5  ALBUMIN 3.6   No results found for this basename: LIPASE, AMYLASE,  in the last 168 hours No results found for this basename: AMMONIA,  in the last 168 hours CBC:  Recent Labs Lab 02/27/13 1914 02/27/13 2153 02/28/13 0410  WBC 6.3 5.8 5.6  HGB 15.5 15.4 15.5  HCT 44.3 44.3 45.3  MCV 86.2 86.0 86.9  PLT 198 185 198   Cardiac Enzymes:  Recent Labs Lab 02/27/13 2153 02/28/13 0410 02/28/13 0913  TROPONINI <0.30 <0.30 <0.30   BNP: BNP (last 3 results) No results found for this basename: PROBNP,  in the last 8760 hours CBG: No results found for this basename: GLUCAP,  in the last 168 hours  Time coordinating discharge: 45 minutes  Signed:  Jaquaveon Bilal  [facility] 02/28/2013, 10:53 AM

## 2013-07-11 ENCOUNTER — Emergency Department (HOSPITAL_COMMUNITY)
Admission: EM | Admit: 2013-07-11 | Discharge: 2013-07-12 | Disposition: A | Discharge status: Home / Self Care | Attending: Emergency Medicine | Admitting: Emergency Medicine

## 2013-07-11 ENCOUNTER — Encounter (HOSPITAL_COMMUNITY): Payer: Self-pay | Admitting: Emergency Medicine

## 2013-07-11 DIAGNOSIS — M719 Bursopathy, unspecified: Secondary | ICD-10-CM

## 2013-07-11 DIAGNOSIS — G8929 Other chronic pain: Secondary | ICD-10-CM

## 2013-07-11 DIAGNOSIS — K219 Gastro-esophageal reflux disease without esophagitis: Secondary | ICD-10-CM | POA: Insufficient documentation

## 2013-07-11 DIAGNOSIS — M25569 Pain in unspecified knee: Secondary | ICD-10-CM | POA: Insufficient documentation

## 2013-07-11 DIAGNOSIS — M25561 Pain in right knee: Secondary | ICD-10-CM

## 2013-07-11 DIAGNOSIS — Z8679 Personal history of other diseases of the circulatory system: Secondary | ICD-10-CM | POA: Insufficient documentation

## 2013-07-11 DIAGNOSIS — Z79899 Other long term (current) drug therapy: Secondary | ICD-10-CM | POA: Insufficient documentation

## 2013-07-11 DIAGNOSIS — Z9889 Other specified postprocedural states: Secondary | ICD-10-CM | POA: Insufficient documentation

## 2013-07-11 DIAGNOSIS — E7800 Pure hypercholesterolemia, unspecified: Secondary | ICD-10-CM | POA: Insufficient documentation

## 2013-07-11 DIAGNOSIS — Z8739 Personal history of other diseases of the musculoskeletal system and connective tissue: Secondary | ICD-10-CM | POA: Insufficient documentation

## 2013-07-11 DIAGNOSIS — M7150 Other bursitis, not elsewhere classified, unspecified site: Secondary | ICD-10-CM | POA: Insufficient documentation

## 2013-07-11 DIAGNOSIS — Z791 Long term (current) use of non-steroidal anti-inflammatories (NSAID): Secondary | ICD-10-CM | POA: Insufficient documentation

## 2013-07-11 NOTE — ED Notes (Signed)
Pt states he has been having 3/10 rt knee pain since he received a cortisone shot 6/10. Pt reports pain is soreness, burning and had some itching to knee that started yesterday. Pt states he has not taken anything for the pain because nothing works. Pt ambulatory to room. Slight bruising seen on knee, pt denies any injury to the area. Pt denies any swelling to area, was ambulatory to room.

## 2013-07-11 NOTE — ED Notes (Addendum)
Pt recently dx with anserine bursitis to right knee. Pt given a cortisone shot on 6/10 and states on 6/15 he started having 3/10 pain to knee with bruising to knee. States pain is progressively worsening, worse with ambulation. Pt concerned he could be having a reaction to shot. Pt denies recent injury. Pt ambulatory to triage. NAD.

## 2013-07-12 ENCOUNTER — Emergency Department (HOSPITAL_COMMUNITY)

## 2013-07-12 LAB — CBC WITH DIFFERENTIAL/PLATELET
Basophils Absolute: 0 10*3/uL (ref 0.0–0.1)
Basophils Relative: 0 % (ref 0–1)
Eosinophils Absolute: 0.1 10*3/uL (ref 0.0–0.7)
Eosinophils Relative: 1 % (ref 0–5)
HEMATOCRIT: 45.8 % (ref 39.0–52.0)
Hemoglobin: 14.9 g/dL (ref 13.0–17.0)
LYMPHS ABS: 1.7 10*3/uL (ref 0.7–4.0)
LYMPHS PCT: 25 % (ref 12–46)
MCH: 29.3 pg (ref 26.0–34.0)
MCHC: 32.5 g/dL (ref 30.0–36.0)
MCV: 90.2 fL (ref 78.0–100.0)
MONOS PCT: 6 % (ref 3–12)
Monocytes Absolute: 0.4 10*3/uL (ref 0.1–1.0)
NEUTROS PCT: 68 % (ref 43–77)
Neutro Abs: 4.6 10*3/uL (ref 1.7–7.7)
Platelets: 196 10*3/uL (ref 150–400)
RBC: 5.08 MIL/uL (ref 4.22–5.81)
RDW: 13.9 % (ref 11.5–15.5)
WBC: 6.7 10*3/uL (ref 4.0–10.5)

## 2013-07-12 LAB — BASIC METABOLIC PANEL
BUN: 19 mg/dL (ref 6–23)
CHLORIDE: 101 meq/L (ref 96–112)
CO2: 29 mEq/L (ref 19–32)
Calcium: 9.4 mg/dL (ref 8.4–10.5)
Creatinine, Ser: 1.27 mg/dL (ref 0.50–1.35)
GFR calc Af Amer: 74 mL/min — ABNORMAL LOW (ref >90)
GFR calc non Af Amer: 64 mL/min — ABNORMAL LOW (ref >90)
Glucose, Bld: 137 mg/dL — ABNORMAL HIGH (ref 70–99)
Potassium: 3.9 mEq/L (ref 3.7–5.3)
SODIUM: 142 meq/L (ref 137–147)

## 2013-07-12 LAB — SEDIMENTATION RATE: SED RATE: 1 mm/h (ref 0–16)

## 2013-07-12 MED ORDER — IBUPROFEN 600 MG PO TABS: 600.0000 mg | ORAL_TABLET | Freq: Four times a day (QID) | ORAL | Status: DC | PRN

## 2013-07-12 NOTE — Discharge Instructions (Signed)
Please call your doctor for a followup appointment within 24-48 hours. When you talk to your doctor please let them know that you were seen in the emergency department and have them acquire all of your records so that they can discuss the findings with you and formulate a treatment plan to fully care for your new and ongoing problems. Please call and set-up an appointment with Orthopedics  Please rest and stay hydrated Please wear knee brace when active - while resting please rest, ice, elevate-please prop up knee with pillows Please avoid any physical or strenuous activity Please acquire proper insoles for the shoe Please continue to monitor symptoms closely if symptoms are to worsen or change (fever greater than 101, chills, chest pain, shortness of breath, difficulty breathing, fall, injury, swelling, redness, hot to the touch, numbness, tingling, loss of sensation, red streaks running down the leg) please report back to the ED immediately    Bursitis Bursitis is a swelling and soreness (inflammation) of a fluid-filled sac (bursa) that overlies and protects a joint. It can be caused by injury, overuse of the joint, arthritis or infection. The joints most likely to be affected are the elbows, shoulders, hips and knees. HOME CARE INSTRUCTIONS   Apply ice to the affected area for 15-20 minutes each hour while awake for 2 days. Put the ice in a plastic bag and place a towel between the bag of ice and your skin.  Rest the injured joint as much as possible, but continue to put the joint through a full range of motion, 4 times per day. (The shoulder joint especially becomes rapidly "frozen" if not used.) When the pain lessens, begin normal slow movements and usual activities.  Only take over-the-counter or prescription medicines for pain, discomfort or fever as directed by your caregiver.  Your caregiver may recommend draining the bursa and injecting medicine into the bursa. This may help the healing  process.  Follow all instructions for follow-up with your caregiver. This includes any orthopedic referrals, physical therapy and rehabilitation. Any delay in obtaining necessary care could result in a delay or failure of the bursitis to heal and chronic pain. SEEK IMMEDIATE MEDICAL CARE IF:   Your pain increases even during treatment.  You develop an oral temperature above 102 F (38.9 C) and have heat and inflammation over the involved bursa. MAKE SURE YOU:   Understand these instructions.  Will watch your condition.  Will get help right away if you are not doing well or get worse. Document Released: 01/02/2000 Document Revised: 03/29/2011 Document Reviewed: 12/06/2008 [facility] Patient Information 2015 [employer]. This information is not intended to replace advice given to you by your health care provider. Make sure you discuss any questions you have with your health care provider.

## 2013-07-12 NOTE — ED Provider Notes (Signed)
CSN: [id number]     Arrival date & time 07/11/13  2031 History   First MD Initiated Contact with Patient 07/12/13 0017     Chief Complaint  Patient presents with  . Knee Pain     (Consider location/radiation/quality/duration/timing/severity/associated sxs/prior Treatment) The history is provided by the patient. No language interpreter was used.  Frank Baxter is a 53 year old male with past medical history of high cholesterol, sleep apnea, GERD, migraines, arthritis presenting to the ED with right knee pain. As per patient, he has been dealing with right knee pain for the past 18-20 years. Reported that the pain exacerbated her appear today. Stated that he is followed by the [facility] in [place] which he was recently seen on 06/27/2013 where cortisone injections were administered. Patient reports that he's been noticing right knee discomfort described as a burning sensation without radiation. Stated that the knee is always swollen. Reported that he's been using Motrin and Celebrex with minimal relief. Stated that he was recently diagnosed with bursitis. As per patient, reported that when he is seen other orthopedics outside the [facility] system he is requested to get surgery-as per the [facility], reported that patient does not need surgery at this time. Stated that the pain worsens with applying pressure-states that he is at work and is on his feet at all times-reports that he wears tennis shoes. Denied fever, chills, fall, injury, hot to the touch, redness, red streaks, tingling, loss sensation, chest pain, shortness of breath, difficulty breathing. [facility] [place]  Past Medical History  Diagnosis Date  . High cholesterol   . Sleep apnea     "masks don't work for me" (02/27/2013)  . GERD (gastroesophageal reflux disease)   . Headache(784.0)     "used to be constant; since glasses maybe couple times/month" (02/27/2013)  . Migraine     maybe twice/yr" (02/27/2013)  . Arthritis     "right knee" (02/27/2013)    Past Surgical History  Procedure Laterality Date  . Adenoidectomy  2012  . Tonsillectomy  ~ 1973  . Knee arthroscopy Right 03/2002  . Nasal septum surgery  2012   No family history on file. History  Substance Use Topics  . Smoking status: Never Smoker   . Smokeless tobacco: Never Used  . Alcohol Use: Yes     Comment: 02/27/2013 "maybe a 6 pack of beer/month"    Review of Systems  Constitutional: Negative for fever and chills.  Respiratory: Negative for chest tightness and shortness of breath.   Cardiovascular: Negative for chest pain.  Musculoskeletal: Positive for arthralgias (right knee). Negative for gait problem and myalgias.  Neurological: Negative for weakness and numbness.      Allergies  Review of patient's allergies indicates no known allergies.  Home Medications   Prior to Admission medications   Medication Sig Start Date End Date Taking? Authorizing Provider  celecoxib (CELEBREX) 200 MG capsule Take 200 mg by mouth daily with breakfast.   Yes Historical Provider, MD  esomeprazole (NEXIUM) 40 MG capsule Take 40 mg by mouth at bedtime.   Yes Historical Provider, MD  ezetimibe (ZETIA) 10 MG tablet Take 10 mg by mouth daily.   Yes Historical Provider, MD  ibuprofen (ADVIL,MOTRIN) 200 MG tablet Take 800 mg by mouth daily as needed (pain).   Yes Historical Provider, MD  niacin 100 MG tablet Take 100 mg by mouth at bedtime.   Yes Historical Provider, MD   BP 120/76  Pulse 71  Temp(Src) 97.8 F (36.6 C) (Oral)  Resp 18  SpO2 98% Physical Exam  Nursing note and vitals reviewed. Constitutional: He is oriented to person, place, and time. He appears well-developed and well-nourished. No distress.  HENT:  Head: Normocephalic and atraumatic.  Eyes: Conjunctivae and EOM are normal. Pupils are equal, round, and reactive to light. Right eye exhibits no discharge. Left eye exhibits no discharge.  Neck: Normal range of motion. Neck supple. No tracheal deviation present.   Cardiovascular: Normal rate, regular rhythm and normal heart sounds.  Exam reveals no friction rub.   No murmur heard. Pulses:      Radial pulses are 2+ on the right side, and 2+ on the left side.       Dorsalis pedis pulses are 2+ on the right side, and 2+ on the left side.  Cap refill < 3 seconds  Pulmonary/Chest: Effort normal and breath sounds normal. No respiratory distress. He has no wheezes. He has no rales.  Musculoskeletal: Normal range of motion. He exhibits tenderness. He exhibits no edema.       Right knee: He exhibits swelling (mild) and ecchymosis. He exhibits normal range of motion, no effusion, no deformity, no laceration, no erythema and normal alignment. Tenderness found. Medial joint line and MCL tenderness noted.       Legs: Mild swelling identified to the medial aspect of the right knee with mild discomfort upon palpation. Ecchymosis identified to the anterior aspect of the right knee-just between the patellar regions secondary to injection sites of cortisone. Negative pain upon palpation to the posterior aspect. Patella intact. Full flexion extension noted without difficulty. Negative anterior and posterior draw sign. Negative valgus and varus tension. Stable right knee joint. Full ROM to the right ankle and digits of the right foot without difficulty or ataxia noted. Full ROM to upper and lower extremities without difficulty noted, negative ataxia noted.  Lymphadenopathy:    He has no cervical adenopathy.  Neurological: He is alert and oriented to person, place, and time. No cranial nerve deficit. He exhibits normal muscle tone. Coordination normal.  Cranial nerves III-XII grossly intact Strength 5+/5+ to upper and lower extremities bilaterally with resistance applied, equal distribution noted Strength intact to the digits of the right foot Sensation intact with differentiation to sharp and dull touch  Gait proper, proper balance - negative sway, negative drift, negative  step-offs  Skin: Skin is warm and dry. No rash noted. He is not diaphoretic. No erythema.  Psychiatric: He has a normal mood and affect. His behavior is normal. Thought content normal.    ED Course  Procedures (including critical care time)  Results for orders placed during the hospital encounter of 07/11/13  CBC WITH DIFFERENTIAL      Result Value Ref Range   WBC 6.7  4.0 - 10.5 K/uL   RBC 5.08  4.22 - 5.81 MIL/uL   Hemoglobin 14.9  13.0 - 17.0 g/dL   HCT 95.6  21.3 - 08.6 %   MCV 90.2  78.0 - 100.0 fL   MCH 29.3  26.0 - 34.0 pg   MCHC 32.5  30.0 - 36.0 g/dL   RDW 57.8  46.9 - 62.9 %   Platelets 196  150 - 400 K/uL   Neutrophils Relative % 68  43 - 77 %   Neutro Abs 4.6  1.7 - 7.7 K/uL   Lymphocytes Relative 25  12 - 46 %   Lymphs Abs 1.7  0.7 - 4.0 K/uL   Monocytes Relative 6  3 - 12 %  Monocytes Absolute 0.4  0.1 - 1.0 K/uL   Eosinophils Relative 1  0 - 5 %   Eosinophils Absolute 0.1  0.0 - 0.7 K/uL   Basophils Relative 0  0 - 1 %   Basophils Absolute 0.0  0.0 - 0.1 K/uL  BASIC METABOLIC PANEL      Result Value Ref Range   Sodium 142  137 - 147 mEq/L   Potassium 3.9  3.7 - 5.3 mEq/L   Chloride 101  96 - 112 mEq/L   CO2 29  19 - 32 mEq/L   Glucose, Bld 137 (*) 70 - 99 mg/dL   BUN 19  6 - 23 mg/dL   Creatinine, Ser 2.951.27  0.50 - 1.35 mg/dL   Calcium 9.4  8.4 - 62.110.5 mg/dL   GFR calc non Af Amer 64 (*) >90 mL/min   GFR calc Af Amer 74 (*) >90 mL/min    Labs Review Labs Reviewed  BASIC METABOLIC PANEL - Abnormal; Notable for the following:    Glucose, Bld 137 (*)    GFR calc non Af Amer 64 (*)    GFR calc Af Amer 74 (*)    All other components within normal limits  CBC WITH DIFFERENTIAL  SEDIMENTATION RATE    Imaging Review Dg Knee Complete 4 Views Right  07/12/2013   CLINICAL DATA:  Right knee pain since cortisone shot on 06/27/2013.  EXAM: RIGHT KNEE - COMPLETE 4+ VIEW  COMPARISON:  None.  FINDINGS: There is no evidence of fracture, dislocation, or joint  effusion. There is no evidence of arthropathy or other focal bone abnormality. Soft tissues are unremarkable.  IMPRESSION: Negative.   Electronically Signed   By: Burman NievesWilliam  Stevens M.D.   On: 07/12/2013 02:13     EKG Interpretation None      MDM   Final diagnoses:  Bursitis  Chronic knee pain, right    Filed Vitals:   07/11/13 2037 07/12/13 0140  BP: 147/81 120/76  Pulse: 84 71  Temp: 98.2 F (36.8 C) 97.8 F (36.6 C)  TempSrc: Oral Oral  Resp: 18   SpO2: 97% 98%   Patient presenting to the ED with chronic knee pain for the past 18-20 years. Stated he was recently seen at the [facility] on 06/27/2013 where he cortisone injection was administered. Patient reports he was recently diagnosed with bursitis and is being followed by orthopedics at the [facility] in [place].  CBC negative elevated white blood cell count-negative left shift or leukocytosis noted. BMP negative findings-kidney functioning well. Sedimentation rate in process. Plain film of right knee negative for acute osseous injury, dislocation or joint effusion. Soft tissues are unremarkable. Doubt septic joint. Doubt ischemia. Doubt compartment syndrome. Patient presenting to the ED with right knee pain appears to be chronic with ongoing bursitis. Negative focal neurological deficits noted. Pulses palpable and strong to DP and PT bilaterally. Cap refill less than 3 seconds. Stable right knee joint identified. Sensation intact. Labs unremarkable. Ecchymosis noted to the anterior aspect of the right knee - suspicion to be due to the cortisone injections from 06/27/2013. Patient placed in knee sleeve for comfort. Patient stable, afebrile. Discharged patient. Discharged patient with referral to orthopedics and to the [facility]. Discharged patient with NSAIDs. Discussed with patient to rest, ice, elevate. Recommended abortive therapy. Discussed with patient to closely monitor symptoms and if symptoms are to worsen or change to report back to the ED - strict  return instructions given.  Patient agreed to plan of care,  understood, all questions answered.   [employer], PA-C 07/12/13 0320

## 2013-07-15 NOTE — ED Provider Notes (Signed)
Medical screening examination/treatment/procedure(s) were performed by non-physician practitioner and as supervising physician I was immediately available for consultation/collaboration.   John David Wofford III, MD 07/15/13 1747 

## 2013-10-16 ENCOUNTER — Encounter (HOSPITAL_COMMUNITY): Payer: Self-pay | Admitting: Emergency Medicine

## 2013-10-16 ENCOUNTER — Emergency Department (HOSPITAL_COMMUNITY)

## 2013-10-16 ENCOUNTER — Emergency Department (HOSPITAL_COMMUNITY)
Admission: EM | Admit: 2013-10-16 | Discharge: 2013-10-17 | Disposition: A | Discharge status: Home / Self Care | Attending: Emergency Medicine | Admitting: Emergency Medicine

## 2013-10-16 DIAGNOSIS — E7800 Pure hypercholesterolemia, unspecified: Secondary | ICD-10-CM | POA: Insufficient documentation

## 2013-10-16 DIAGNOSIS — M1710 Unilateral primary osteoarthritis, unspecified knee: Secondary | ICD-10-CM | POA: Diagnosis not present

## 2013-10-16 DIAGNOSIS — M79609 Pain in unspecified limb: Secondary | ICD-10-CM | POA: Insufficient documentation

## 2013-10-16 DIAGNOSIS — IMO0002 Reserved for concepts with insufficient information to code with codable children: Secondary | ICD-10-CM

## 2013-10-16 DIAGNOSIS — K219 Gastro-esophageal reflux disease without esophagitis: Secondary | ICD-10-CM | POA: Insufficient documentation

## 2013-10-16 DIAGNOSIS — M549 Dorsalgia, unspecified: Secondary | ICD-10-CM | POA: Diagnosis present

## 2013-10-16 DIAGNOSIS — Z79899 Other long term (current) drug therapy: Secondary | ICD-10-CM | POA: Diagnosis not present

## 2013-10-16 DIAGNOSIS — Z791 Long term (current) use of non-steroidal anti-inflammatories (NSAID): Secondary | ICD-10-CM | POA: Insufficient documentation

## 2013-10-16 DIAGNOSIS — R079 Chest pain, unspecified: Secondary | ICD-10-CM | POA: Diagnosis not present

## 2013-10-16 DIAGNOSIS — Z8679 Personal history of other diseases of the circulatory system: Secondary | ICD-10-CM | POA: Insufficient documentation

## 2013-10-16 DIAGNOSIS — R61 Generalized hyperhidrosis: Secondary | ICD-10-CM | POA: Diagnosis not present

## 2013-10-16 DIAGNOSIS — M546 Pain in thoracic spine: Secondary | ICD-10-CM | POA: Insufficient documentation

## 2013-10-16 DIAGNOSIS — M79602 Pain in left arm: Secondary | ICD-10-CM

## 2013-10-16 LAB — CBC WITH DIFFERENTIAL/PLATELET
BASOS ABS: 0 10*3/uL (ref 0.0–0.1)
Basophils Relative: 0 % (ref 0–1)
Eosinophils Absolute: 0.1 10*3/uL (ref 0.0–0.7)
Eosinophils Relative: 2 % (ref 0–5)
HCT: 43.7 % (ref 39.0–52.0)
HEMOGLOBIN: 14.9 g/dL (ref 13.0–17.0)
LYMPHS PCT: 29 % (ref 12–46)
Lymphs Abs: 1.6 10*3/uL (ref 0.7–4.0)
MCH: 29.4 pg (ref 26.0–34.0)
MCHC: 34.1 g/dL (ref 30.0–36.0)
MCV: 86.4 fL (ref 78.0–100.0)
MONO ABS: 0.4 10*3/uL (ref 0.1–1.0)
MONOS PCT: 8 % (ref 3–12)
NEUTROS ABS: 3.4 10*3/uL (ref 1.7–7.7)
NEUTROS PCT: 61 % (ref 43–77)
Platelets: 196 10*3/uL (ref 150–400)
RBC: 5.06 MIL/uL (ref 4.22–5.81)
RDW: 13.8 % (ref 11.5–15.5)
WBC: 5.5 10*3/uL (ref 4.0–10.5)

## 2013-10-16 LAB — TROPONIN I: Troponin I: <0.3 ng/mL (ref <0.30)

## 2013-10-16 LAB — BASIC METABOLIC PANEL
Anion gap: 11 (ref 5–15)
BUN: 14 mg/dL (ref 6–23)
CHLORIDE: 102 meq/L (ref 96–112)
CO2: 25 meq/L (ref 19–32)
CREATININE: 1.13 mg/dL (ref 0.50–1.35)
Calcium: 8.8 mg/dL (ref 8.4–10.5)
GFR calc Af Amer: 85 mL/min — ABNORMAL LOW (ref >90)
GFR calc non Af Amer: 73 mL/min — ABNORMAL LOW (ref >90)
Glucose, Bld: 94 mg/dL (ref 70–99)
Potassium: 4 mEq/L (ref 3.7–5.3)
Sodium: 138 mEq/L (ref 137–147)

## 2013-10-16 MED ORDER — OXYCODONE-ACETAMINOPHEN 5-325 MG PO TABS: 1.0000 | ORAL_TABLET | ORAL | Status: AC | PRN

## 2013-10-16 MED ORDER — MORPHINE SULFATE 4 MG/ML IJ SOLN: 4.0000 mg | Freq: Once | INTRAMUSCULAR | Status: DC

## 2013-10-16 MED ORDER — ONDANSETRON HCL 4 MG PO TABS: 4.0000 mg | ORAL_TABLET | Freq: Four times a day (QID) | ORAL | Status: AC

## 2013-10-16 MED ORDER — ACETAMINOPHEN 500 MG PO TABS
1000.0000 mg | ORAL_TABLET | Freq: Once | ORAL | Status: AC
  Administered 2013-10-16: 1000 mg via ORAL
  Filled 2013-10-16: qty 2

## 2013-10-16 MED ORDER — ONDANSETRON HCL 4 MG/2ML IJ SOLN: 4.0000 mg | Freq: Once | INTRAMUSCULAR | Status: DC

## 2013-10-16 NOTE — ED Provider Notes (Signed)
CSN: [id number]     Arrival date & time 10/16/13  1954 History   First MD Initiated Contact with Patient 10/16/13 2013     Chief Complaint  Patient presents with  . Back Pain     (Consider location/radiation/quality/duration/timing/severity/associated sxs/prior Treatment) HPI Comments: Patient is a 53 year old male history of hyperlipidemia, sleep apnea, GERD who presents to the emergency department for evaluation of the back and arm pain. He reports that this began 4 days ago and has been constant. His left arm pain is worse with movement. His arm pain sometimes radiates into his chest. The pain is at his maximum when he is at work using tools at work. He had 2 episodes of diaphoresis yesterday. His diaphoresis resolved spontaneously after approximately 5 minutes. He denies nausea or vomiting. He has positive family history of early heart disease with his uncle dying of an MI at the age of 52. He denies smoking cigarettes. He has never seen a cardiologist.  The history is provided by the patient. No language interpreter was used.    Past Medical History  Diagnosis Date  . High cholesterol   . Sleep apnea     "masks don't work for me" (02/27/2013)  . GERD (gastroesophageal reflux disease)   . Headache(784.0)     "used to be constant; since glasses maybe couple times/month" (02/27/2013)  . Migraine     maybe twice/yr" (02/27/2013)  . Arthritis     "right knee" (02/27/2013)   Past Surgical History  Procedure Laterality Date  . Adenoidectomy  2012  . Tonsillectomy  ~ 1973  . Knee arthroscopy Right 03/2002  . Nasal septum surgery  2012   No family history on file. History  Substance Use Topics  . Smoking status: Never Smoker   . Smokeless tobacco: Never Used  . Alcohol Use: Yes     Comment: 02/27/2013 "maybe a 6 pack of beer/month"    Review of Systems  Constitutional: Positive for diaphoresis. Negative for fever and chills.  Respiratory: Negative for shortness of breath.     Cardiovascular: Positive for chest pain.  Gastrointestinal: Negative for nausea, vomiting and abdominal pain.  Musculoskeletal: Positive for back pain and myalgias.  All other systems reviewed and are negative.     Allergies  Review of patient's allergies indicates no known allergies.  Home Medications   Prior to Admission medications   Medication Sig Start Date End Date Taking? Authorizing Provider  celecoxib (CELEBREX) 200 MG capsule Take 200 mg by mouth daily with breakfast.    Historical Provider, MD  esomeprazole (NEXIUM) 40 MG capsule Take 40 mg by mouth at bedtime.    Historical Provider, MD  ezetimibe (ZETIA) 10 MG tablet Take 10 mg by mouth daily.    Historical Provider, MD  ibuprofen (ADVIL,MOTRIN) 200 MG tablet Take 800 mg by mouth daily as needed (pain).    Historical Provider, MD  ibuprofen (ADVIL,MOTRIN) 600 MG tablet Take 1 tablet (600 mg total) by mouth every 6 (six) hours as needed. 07/12/13   Marissa Sciacca, PA-C  niacin 100 MG tablet Take 100 mg by mouth at bedtime.    Historical Provider, MD   BP 141/88  Pulse 82  Temp(Src) 98.3 F (36.8 C) (Oral)  Resp 14  Ht 5\' 9"  (1.753 m)  Wt 183 lb (83.008 kg)  BMI 27.01 kg/m2  SpO2 97% Physical Exam  Nursing note and vitals reviewed. Constitutional: He is oriented to person, place, and time. He appears well-developed and well-nourished. No distress.  Well appearing  HENT:  Head: Normocephalic and atraumatic.  Right Ear: External ear normal.  Left Ear: External ear normal.  Nose: Nose normal.  Eyes: Conjunctivae are normal.  Neck: Normal range of motion. No tracheal deviation present.  Cardiovascular: Normal rate, regular rhythm, normal heart sounds, intact distal pulses and normal pulses.   Pulses:      Radial pulses are 2+ on the right side, and 2+ on the left side.       Posterior tibial pulses are 2+ on the right side, and 2+ on the left side.  Pulmonary/Chest: Effort normal and breath sounds normal. No  stridor.  Abdominal: Soft. He exhibits no distension. There is no tenderness.  Musculoskeletal: Normal range of motion.       Back:  Tender to palpation to thoracic back, no lesions or rashes.  Full AROM of left arm, but increased pain with movement. Neurovascularly intact, sensation is intact.   Neurological: He is alert and oriented to person, place, and time.  Skin: Skin is warm and dry. He is not diaphoretic.  Psychiatric: He has a normal mood and affect. His behavior is normal.    ED Course  Procedures (including critical care time) Labs Review Labs Reviewed  BASIC METABOLIC PANEL - Abnormal; Notable for the following:    GFR calc non Af Amer 73 (*)    GFR calc Af Amer 85 (*)    All other components within normal limits  CBC WITH DIFFERENTIAL  TROPONIN I    Imaging Review Dg Chest 2 View  10/16/2013   CLINICAL DATA:  Left upper back pain radiating to the left arm since Friday last week. No injury. Next pineal 10/27/2013  EXAM: CHEST  2 VIEW  COMPARISON:  None.  FINDINGS: The heart size and mediastinal contours are within normal limits. Both lungs are clear. The visualized skeletal structures are unremarkable.  IMPRESSION: No active cardiopulmonary disease.   Electronically Signed   By: Burman Nieves M.D.   On: 10/16/2013 21:33   Dg Shoulder Left  10/16/2013   CLINICAL DATA:  Left upper back pain radiating to the left arm since Friday last week. No injury.  EXAM: LEFT SHOULDER - 2+ VIEW  COMPARISON:  02/27/2013  FINDINGS: There is no evidence of fracture or dislocation. There is no evidence of arthropathy or other focal bone abnormality. Soft tissues are unremarkable.  IMPRESSION: Negative.   Electronically Signed   By: Burman Nieves M.D.   On: 10/16/2013 21:34     EKG Interpretation   Date/Time:  Tuesday October 16 2013 20:38:04 EDT Ventricular Rate:  70 PR Interval:  127 QRS Duration: 86 QT Interval:  377 QTC Calculation: 407 R Axis:   65 Text Interpretation:   Sinus rhythm No significant change since last  tracing Confirmed by HARRISON  MD, FORREST ([id number]) on 10/16/2013 9:12:04 PM      MDM   Final diagnoses:  Left-sided thoracic back pain  Pain of left upper extremity   Patient presents to ED with upper back pain and left arm pain. Pain is reproducible with palpation, likely MSK in nature. CXR is negative for acute cardiopulmonary disease. No mediastinal widening. EKG is not ischemic, negative troponin. No concern for ACS, aneurysm. Patient will follow up with PCP. Pain medication given. Discussed reasons to return to ED immediately. Vital signs stable for discharge. Discussed reasons to return to ED immediately. Vital signs stable for discharge. Patient / Family / Caregiver informed of clinical course, understand medical decision-making  process, and agree with plan.    Mora Bellman, PA-C 10/18/13 [phone]

## 2013-10-16 NOTE — ED Notes (Signed)
Patient transported to X-ray 

## 2013-10-16 NOTE — ED Notes (Signed)
Pt. reports left upper back pain radiating tom,left arm onset Friday last week , denies injury or fall . Respirations unlabored / no cough or fever .

## 2013-10-16 NOTE — Discharge Instructions (Signed)

## 2013-10-18 NOTE — ED Provider Notes (Signed)
Medical screening examination/treatment/procedure(s) were conducted as a shared visit with non-physician practitioner(s) and myself.  I personally evaluated the patient during the encounter.   EKG Interpretation   Date/Time:  Tuesday October 16 2013 20:38:04 EDT Ventricular Rate:  70 PR Interval:  127 QRS Duration: 86 QT Interval:  377 QTC Calculation: 407 R Axis:   65 Text Interpretation:  Sinus rhythm No significant change since last  tracing Confirmed by Hanadi Stanly  MD, Aws Shere ([id number]) on 10/16/2013 9:12:04 PM      I interviewed and examined the patient. Lungs are CTAB. Cardiac exam wnl. Abdomen soft.  Considered ACS, aortic dissection. Suspect this to be msk given reproduction w/ rom of left arm. Equal pulses and BPs in bilateral UE's. Pt has continued to appear well here. I interpreted/reviewed the labs and/or imaging which were non-contributory.  Will d/c home w/ return precautions.    Frank SheffieldForrest Romy Mcgue, MD 10/18/13 1217

## 2013-10-19 ENCOUNTER — Emergency Department (HOSPITAL_COMMUNITY)
Admission: EM | Admit: 2013-10-19 | Discharge: 2013-10-19 | Disposition: A | Discharge status: Home / Self Care | Attending: Emergency Medicine | Admitting: Emergency Medicine

## 2013-10-19 ENCOUNTER — Encounter (HOSPITAL_COMMUNITY): Payer: Self-pay | Admitting: Emergency Medicine

## 2013-10-19 DIAGNOSIS — M1990 Unspecified osteoarthritis, unspecified site: Secondary | ICD-10-CM | POA: Insufficient documentation

## 2013-10-19 DIAGNOSIS — Z791 Long term (current) use of non-steroidal anti-inflammatories (NSAID): Secondary | ICD-10-CM | POA: Insufficient documentation

## 2013-10-19 DIAGNOSIS — E780 Pure hypercholesterolemia: Secondary | ICD-10-CM | POA: Insufficient documentation

## 2013-10-19 DIAGNOSIS — K219 Gastro-esophageal reflux disease without esophagitis: Secondary | ICD-10-CM | POA: Insufficient documentation

## 2013-10-19 DIAGNOSIS — Z8679 Personal history of other diseases of the circulatory system: Secondary | ICD-10-CM | POA: Diagnosis not present

## 2013-10-19 DIAGNOSIS — Z87828 Personal history of other (healed) physical injury and trauma: Secondary | ICD-10-CM | POA: Diagnosis not present

## 2013-10-19 DIAGNOSIS — Z79899 Other long term (current) drug therapy: Secondary | ICD-10-CM | POA: Diagnosis not present

## 2013-10-19 DIAGNOSIS — M25512 Pain in left shoulder: Secondary | ICD-10-CM

## 2013-10-19 MED ORDER — HYDROCODONE-ACETAMINOPHEN 5-325 MG PO TABS: 1.0000 | ORAL_TABLET | Freq: Four times a day (QID) | ORAL | Status: AC | PRN
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
## 2013-10-19 NOTE — ED Notes (Addendum)
Pt reports left shoulder pain x 1 week. Worse with movement. Pt recently seen here for same and told it was muscloskeletal. States he was prescribed Oxycodone 5-325 mg with no relief. Denies any new changes. Pt in NAD.

## 2013-10-20 NOTE — ED Provider Notes (Signed)
Medical screening examination/treatment/procedure(s) were performed by non-physician practitioner and as supervising physician I was immediately available for consultation/collaboration.   EKG Interpretation None       Nico Rogness M Annisha Baar, MD 10/20/13 0136 

## 2014-05-19 ENCOUNTER — Emergency Department (INDEPENDENT_AMBULATORY_CARE_PROVIDER_SITE_OTHER)
Admission: EM | Admit: 2014-05-19 | Discharge: 2014-05-19 | Disposition: A | Discharge status: Home / Self Care | Source: Home / Self Care | Attending: Family Medicine | Admitting: Family Medicine

## 2014-05-19 DIAGNOSIS — L089 Local infection of the skin and subcutaneous tissue, unspecified: Secondary | ICD-10-CM

## 2014-05-19 DIAGNOSIS — S61232A Puncture wound without foreign body of right middle finger without damage to nail, initial encounter: Secondary | ICD-10-CM

## 2014-05-19 MED ORDER — CEPHALEXIN 500 MG PO CAPS: 500.0000 mg | ORAL_CAPSULE | Freq: Three times a day (TID) | ORAL | Status: AC

## 2014-05-19 NOTE — ED Notes (Signed)
Right middle finger injury x 1 week ago. Awoke this a.m to finger hurting with swelling. AUpright

## 2014-05-19 NOTE — ED Provider Notes (Signed)
CSN: [id number]     Arrival date & time 05/19/14  1651 History   First MD Initiated Contact with Patient 05/19/14 1756     Chief Complaint  Patient presents with  . Finger Injury   (Consider location/radiation/quality/duration/timing/severity/associated sxs/prior Treatment) HPI Comments: Patient report for evaluation of puncture wound to right middle finger that occurred while at work on 05/17/2014. 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 States area has become red, swollen and increasingly painful over the past 24 hours. Last tetanus booster 6 years ago. Reports himself to be otherwise healthy.   The history is provided by the patient.    Past Medical History  Diagnosis Date  . High cholesterol   . Sleep apnea     "masks don't work for me" (02/27/2013)  . GERD (gastroesophageal reflux disease)   . Headache(784.0)     "used to be constant; since glasses maybe couple times/month" (02/27/2013)  . Migraine     maybe twice/yr" (02/27/2013)  . Arthritis     "right knee" (02/27/2013)   Past Surgical History  Procedure Laterality Date  . Adenoidectomy  2012  . Tonsillectomy  ~ 1973  . Knee arthroscopy Right 03/2002  . Nasal septum surgery  2012   No family history on file. History  Substance Use Topics  . Smoking status: Never Smoker   . Smokeless tobacco: Never Used  . Alcohol Use: Yes     Comment: 02/27/2013 "maybe a 6 pack of beer/month"    Review of Systems  All other systems reviewed and are negative.   Allergies  Review of patient's allergies indicates no known allergies.  Home Medications   Prior to Admission medications   Medication Sig Start Date End Date Taking? Authorizing Provider  celecoxib (CELEBREX) 200 MG capsule Take 200 mg by mouth at bedtime.     Historical Provider, MD  cephALEXin (KEFLEX) 500 MG capsule Take 1 capsule (500 mg total) by mouth  3 (three) times daily. X 7 days 05/19/14   Ria ClockJennifer Lee H Burdell Peed, PA  esomeprazole (NEXIUM) 40 MG capsule Take 40 mg by mouth at bedtime.    Historical Provider, MD  ezetimibe (ZETIA) 10 MG tablet Take 10 mg by mouth daily.    Historical Provider, MD  HYDROcodone-acetaminophen (NORCO/VICODIN) 5-325 MG per tablet Take 1-2 tablets by mouth every 6 (six) hours as needed for severe pain. 10/19/13   Felicie Mornavid Smith, NP  ibuprofen (ADVIL,MOTRIN) 200 MG tablet Take 800 mg by mouth daily as needed for moderate pain.     Historical Provider, MD  ondansetron (ZOFRAN) 4 MG tablet Take 1 tablet (4 mg total) by mouth every 6 (six) hours. 10/16/13   Junious SilkHannah Merrell, PA-C  oxyCODONE-acetaminophen (PERCOCET/ROXICET) 5-325 MG per tablet Take 1 tablet by mouth every 4 (four) hours as needed for severe pain. May take 2 tablets PO q 6 hours for severe pain - Do not take with Tylenol as this tablet already contains tylenol 10/16/13   Junious SilkHannah Merrell, PA-C  PRESCRIPTION MEDICATION Take 2 tablets by mouth at bedtime. "Anti-inflammatory"    Historical Provider, MD  PRESCRIPTION MEDICATION Apply 1 application topically as needed (for pain).    Historical Provider, MD   BP 138/79 mmHg  Pulse 61  Temp(Src) 97.2 F (36.2 C) (Oral)  Resp 18  SpO2 96% Physical Exam  Constitutional: He is oriented to person,  place, and time. He appears well-developed and well-nourished.  HENT:  Head: Normocephalic and atraumatic.  Cardiovascular: Normal rate.   Pulmonary/Chest: Effort normal.  Musculoskeletal: Normal range of motion.  Neurological: He is alert and oriented to person, place, and time.  Skin: Skin is warm and dry.  At pad of right middle finger there is a 1.5c x 1cm yellow cloudy fluid filled superficial blister  Psychiatric: He has a normal mood and affect. His behavior is normal.  Nursing note and vitals reviewed.   ED Course  INCISION AND DRAINAGE Date/Time: 05/19/2014 7:32 PM Performed by: Lemmie Evens LEE  H Authorized by: Bradd Canary D Consent: Verbal consent obtained. Written consent not obtained. Risks and benefits: risks, benefits and alternatives were discussed Consent given by: patient Patient identity confirmed: verbally with patient Time out: Immediately prior to procedure a "time out" was called to verify the correct patient, procedure, equipment, support staff and site/side marked as required. Type: abscess Body area: upper extremity Location details: right long finger Local anesthetic: co-phenylcaine spray Patient sedated: no Scalpel size: 11 Incision type: single straight Complexity: simple Drainage: purulent Drainage amount: moderate Wound treatment: wound left open Packing material: none Patient tolerance: Patient tolerated the procedure well with no immediate complications Comments: Small nick made to unroof blister. Wound explored. No FB.    (including critical care time) Labs Review Labs Reviewed - No data to display  Imaging Review No results found.   MDM   1. Puncture wound of right middle finger without foreign body without damage to nail, initial encounter   2. Infection of skin of finger   Patient advised regarding wound care at home. Cephalexin as prescribed. Return if no improvement over next 48 hours. Declined Tdap booster.    Ria Clock, [place] 05/19/14 [phone]

## 2014-05-19 NOTE — Discharge Instructions (Signed)
Fingertip Infection When an infection is around the nail, it is called a paronychia. When it appears over the tip of the finger, it is called a felon. These infections are due to minor injuries or cracks in the skin. If they are not treated properly, they can lead to bone infection and permanent damage to the fingernail. Incision and drainage is necessary if a pus pocket (an abscess) has formed. Antibiotics and pain medicine may also be needed. Keep your hand elevated for the next 2-3 days to reduce swelling and pain. If a pack was placed in the abscess, it should be removed in 1-2 days by your caregiver. Soak the finger in warm water for 20 minutes 4 times daily to help promote drainage. Keep the hands as dry as possible. Wear protective gloves with cotton liners. See your caregiver for follow-up care as recommended.  HOME CARE INSTRUCTIONS   Keep wound clean, dry and dressed as suggested by your caregiver.  Soak in warm salt water for fifteen minutes, four times per day for bacterial infections.  Your caregiver will prescribe an antibiotic if a bacterial infection is suspected. Take antibiotics as directed and finish the prescription, even if the problem appears to be improving before the medicine is gone.  Only take over-the-counter or prescription medicines for pain, discomfort, or fever as directed by your caregiver. SEEK IMMEDIATE MEDICAL CARE IF:  There is redness, swelling, or increasing pain in the wound.  Pus or any other unusual drainage is coming from the wound.  An unexplained oral temperature above 102 F (38.9 C) develops.  You notice a foul smell coming from the wound or dressing. MAKE SURE YOU:   Understand these instructions.  Monitor your condition.  Contact your caregiver if you are getting worse or not improving. Document Released: 02/12/2004 Document Revised: 03/29/2011 Document Reviewed: 02/08/2008 [facility] Patient Information 2015 [place]. This  information is not intended to replace advice given to you by your health care provider. Make sure you discuss any questions you have with your health care provider.  Puncture Wound A puncture wound is an injury that extends through all layers of the skin and into the tissue beneath the skin (subcutaneous tissue). Puncture wounds become infected easily because germs often enter the body and go beneath the skin during the injury. Having a deep wound with a small entrance point makes it difficult for your caregiver to adequately clean the wound. This is especially true if you have stepped on a nail and it has passed through a dirty shoe or other situations where the wound is obviously contaminated. CAUSES  Many puncture wounds involve glass, nails, splinters, fish hooks, or other objects that enter the skin (foreign bodies). A puncture wound may also be caused by a human bite or animal bite. DIAGNOSIS  A puncture wound is usually diagnosed by your history and a physical exam. You may need to have an X-ray or an ultrasound to check for any foreign bodies still in the wound. TREATMENT   Your caregiver will clean the wound as thoroughly as possible. Depending on the location of the wound, a bandage (dressing) may be applied.  Your caregiver might prescribe antibiotic medicines.  You may need a follow-up visit to check on your wound. Follow all instructions as directed by your caregiver. HOME CARE INSTRUCTIONS   Change your dressing once per day, or as directed by your caregiver. If the dressing sticks, it may be removed by soaking the area in water.  If  your caregiver has given you follow-up instructions, it is very important that you return for a follow-up appointment. Not following up as directed could result in a chronic or permanent injury, pain, and disability.  Only take over-the-counter or prescription medicines for pain, discomfort, or fever as directed by your caregiver.  If you are given  antibiotics, take them as directed. Finish them even if you start to feel better. You may need a tetanus shot if:  You cannot remember when you had your last tetanus shot.  You have never had a tetanus shot. If you got a tetanus shot, your arm may swell, get red, and feel warm to the touch. This is common and not a problem. If you need a tetanus shot and you choose not to have one, there is a rare chance of getting tetanus. Sickness from tetanus can be serious. You may need a rabies shot if an animal bite caused your puncture wound. SEEK MEDICAL CARE IF:   You have redness, swelling, or increasing pain in the wound.  You have red streaks going away from the wound.  You notice a bad smell coming from the wound or dressing.  You have yellowish-white fluid (pus) coming from the wound.  You are treated with an antibiotic for infection, but the infection is not getting better.  You notice something in the wound, such as rubber from your shoe, cloth, or another object.  You have a fever.  You have severe pain.  You have difficulty breathing.  You feel dizzy or faint.  You cannot stop vomiting.  You lose feeling, develop numbness, or cannot move a limb below the wound.  Your symptoms worsen. MAKE SURE YOU:  Understand these instructions.  Will watch your condition.  Will get help right away if you are not doing well or get worse. Document Released: 10/14/2004 Document Revised: 03/29/2011 Document Reviewed: 06/23/2010 [facility] Patient Information 2015 [place], [place]. This information is not intended to replace advice given to you by your health care provider. Make sure you discuss any questions you have with your health care provider.

## 2014-07-31 ENCOUNTER — Emergency Department (HOSPITAL_COMMUNITY)
Admission: EM | Admit: 2014-07-31 | Discharge: 2014-07-31 | Disposition: A | Discharge status: Home / Self Care | Attending: Emergency Medicine | Admitting: Emergency Medicine

## 2014-07-31 ENCOUNTER — Encounter (HOSPITAL_COMMUNITY): Payer: Self-pay | Admitting: *Deleted

## 2014-07-31 DIAGNOSIS — H6692 Otitis media, unspecified, left ear: Secondary | ICD-10-CM | POA: Insufficient documentation

## 2014-07-31 DIAGNOSIS — H9202 Otalgia, left ear: Secondary | ICD-10-CM | POA: Diagnosis present

## 2014-07-31 DIAGNOSIS — E780 Pure hypercholesterolemia: Secondary | ICD-10-CM | POA: Diagnosis not present

## 2014-07-31 DIAGNOSIS — Z79899 Other long term (current) drug therapy: Secondary | ICD-10-CM | POA: Insufficient documentation

## 2014-07-31 DIAGNOSIS — K219 Gastro-esophageal reflux disease without esophagitis: Secondary | ICD-10-CM | POA: Insufficient documentation

## 2014-07-31 DIAGNOSIS — J029 Acute pharyngitis, unspecified: Secondary | ICD-10-CM | POA: Insufficient documentation

## 2014-07-31 LAB — RAPID STREP SCREEN (MED CTR MEBANE ONLY): STREPTOCOCCUS, GROUP A SCREEN (DIRECT): NEGATIVE

## 2014-07-31 MED ORDER — LORATADINE 10 MG PO TABS: 10.0000 mg | ORAL_TABLET | Freq: Every day | ORAL | Status: AC

## 2014-07-31 MED ORDER — AMOXICILLIN 500 MG PO CAPS: 500.0000 mg | ORAL_CAPSULE | Freq: Three times a day (TID) | ORAL | Status: AC

## 2014-07-31 MED ORDER — AMOXICILLIN 500 MG PO CAPS
500.0000 mg | ORAL_CAPSULE | Freq: Once | ORAL | Status: AC
  Administered 2014-07-31: 500 mg via ORAL
  Filled 2014-07-31: qty 1

## 2014-07-31 NOTE — Discharge Instructions (Signed)
Take Amoxicillin as directed until gone. Take loratidine as needed for ear fullness. Refer to attached documents for more information.

## 2014-07-31 NOTE — ED Provider Notes (Signed)
CSN: [id number]     Arrival date & time 07/31/14  [id number] History   First MD Initiated Contact with Patient 07/31/14 [phone]     Chief Complaint  Patient presents with  . Sore Throat  . Generalized Body Aches  . Otalgia     (Consider location/radiation/quality/duration/timing/severity/associated sxs/prior Treatment) HPI Comments: Patient is a 54 year old male who presents with a 3 day history of sore throat. Patient reports gradual onset and progressively worsening sharp, severe throat pain. The pain is constant and made worse with swallowing. The pain is localized to the patient's throat and equal on both sides. Nothing alleviates the pain. The patient has tried ibuprofen without relief. Patient reports associated subjective fever, cervical adenopathy, left ear pain, and non productive cough. Patient denies headache, visual changes, sinus congestion, difficulty breathing, chest pain, SOB, abdominal pain, NVD.     Patient is a 54 y.o. male presenting with pharyngitis and ear pain.  Sore Throat Associated symptoms include coughing and a sore throat. Pertinent negatives include no abdominal pain, arthralgias, chest pain, chills, fatigue, fever, nausea, neck pain, vomiting or weakness.  Otalgia Associated symptoms: cough and sore throat   Associated symptoms: no abdominal pain, no diarrhea, no fever, no neck pain and no vomiting     Past Medical History  Diagnosis Date  . High cholesterol   . Sleep apnea     "masks don't work for me" (02/27/2013)  . GERD (gastroesophageal reflux disease)   . Headache(784.0)     "used to be constant; since glasses maybe couple times/month" (02/27/2013)  . Migraine     maybe twice/yr" (02/27/2013)  . Arthritis     "right knee" (02/27/2013)   Past Surgical History  Procedure Laterality Date  . Adenoidectomy  2012  . Tonsillectomy  ~ 1973  . Knee arthroscopy Right 03/2002  . Nasal septum surgery  2012   No family history on file. History  Substance Use  Topics  . Smoking status: Never Smoker   . Smokeless tobacco: Never Used  . Alcohol Use: Yes     Comment: 02/27/2013 "maybe a 6 pack of beer/month"    Review of Systems  Constitutional: Negative for fever, chills and fatigue.  HENT: Positive for ear pain and sore throat. Negative for trouble swallowing.   Eyes: Negative for visual disturbance.  Respiratory: Positive for cough. Negative for shortness of breath.   Cardiovascular: Negative for chest pain and palpitations.  Gastrointestinal: Negative for nausea, vomiting, abdominal pain and diarrhea.  Genitourinary: Negative for dysuria and difficulty urinating.  Musculoskeletal: Negative for arthralgias and neck pain.  Skin: Negative for color change.  Neurological: Negative for dizziness and weakness.  Psychiatric/Behavioral: Negative for dysphoric mood.      Allergies  Review of patient's allergies indicates no known allergies.  Home Medications   Prior to Admission medications   Medication Sig Start Date End Date Taking? Authorizing Provider  celecoxib (CELEBREX) 200 MG capsule Take 200 mg by mouth at bedtime.     Historical Provider, MD  cephALEXin (KEFLEX) 500 MG capsule Take 1 capsule (500 mg total) by mouth 3 (three) times daily. X 7 days 05/19/14   Ria ClockJennifer Lee H Presson, PA  esomeprazole (NEXIUM) 40 MG capsule Take 40 mg by mouth at bedtime.    Historical Provider, MD  ezetimibe (ZETIA) 10 MG tablet Take 10 mg by mouth daily.    Historical Provider, MD  HYDROcodone-acetaminophen (NORCO/VICODIN) 5-325 MG per tablet Take 1-2 tablets by mouth every 6 (six) hours  as needed for severe pain. 10/19/13   Felicie Morn, NP  ibuprofen (ADVIL,MOTRIN) 200 MG tablet Take 800 mg by mouth daily as needed for moderate pain.     Historical Provider, MD  ondansetron (ZOFRAN) 4 MG tablet Take 1 tablet (4 mg total) by mouth every 6 (six) hours. 10/16/13   Junious Silk, PA-C  oxyCODONE-acetaminophen (PERCOCET/ROXICET) 5-325 MG per tablet Take 1  tablet by mouth every 4 (four) hours as needed for severe pain. May take 2 tablets PO q 6 hours for severe pain - Do not take with Tylenol as this tablet already contains tylenol 10/16/13   Junious Silk, PA-C  PRESCRIPTION MEDICATION Take 2 tablets by mouth at bedtime. "Anti-inflammatory"    Historical Provider, MD  PRESCRIPTION MEDICATION Apply 1 application topically as needed (for pain).    Historical Provider, MD   BP 120/62 mmHg  Pulse 57  Temp(Src) 97.9 F (36.6 C) (Oral)  Resp 17  Ht  (1.753 m)  Wt 180 lb (81.647 kg)  BMI 26.57 kg/m2  SpO2 98% Physical Exam  Constitutional: He is oriented to person, place, and time. He appears well-developed and well-nourished. No distress.  HENT:  Head: Normocephalic and atraumatic.  Right Ear: External ear normal.  Left Ear: External ear normal.  Mouth/Throat: No oropharyngeal exudate.  Posterior pharynx erythematous without exudate. Left ear tenderness with retraction of the left auricle.   Eyes: Conjunctivae and EOM are normal. Pupils are equal, round, and reactive to light.  Neck: Normal range of motion.  Cardiovascular: Normal rate and regular rhythm.  Exam reveals no gallop and no friction rub.   No murmur heard. Pulmonary/Chest: Effort normal and breath sounds normal. He has no wheezes. He has no rales. He exhibits no tenderness.  Abdominal: Soft. He exhibits no distension. There is no tenderness. There is no rebound.  Musculoskeletal: Normal range of motion.  Neurological: He is alert and oriented to person, place, and time. Coordination normal.  Speech is goal-oriented. Moves limbs without ataxia.   Skin: Skin is warm and dry.  Psychiatric: He has a normal mood and affect. His behavior is normal.  Nursing note and vitals reviewed.   ED Course  Procedures (including critical care time) Labs Review Labs Reviewed  RAPID STREP SCREEN (NOT AT [facility])  CULTURE, GROUP A STREP    Imaging Review No results found.   EKG  Interpretation None      MDM   Final diagnoses:  Acute left otitis media, recurrence not specified, unspecified otitis media type    4:39 AM Rapid strep pending. Vitals stable and patient afebrile.   4:52 AM Rapid strep negative. Patient will be treated for otitis media on the left. Vitals stable and patient afebrile.   Emilia Beck, PA-C 07/31/14 1610  April Palumbo, MD 07/31/14 [phone]

## 2014-07-31 NOTE — ED Notes (Signed)
Patient presents stating he has had a cough, ear ache and sore throat for about 2-3 days with generalized body aches.  States the last day he has had pain to his left ear feeling like it is full.  Denies N/V/D good appetite

## 2014-08-02 LAB — CULTURE, GROUP A STREP: Strep A Culture: NEGATIVE

## 2016-01-04 IMAGING — CR DG SHOULDER 2+V*L*
3 series · 3 of 3 positions shown · non-contrast
Comparison: 02/27/2013

CLINICAL DATA: Left upper back pain radiating to the left arm since
[REDACTED] last week. No injury.

EXAM:
LEFT SHOULDER - 2+ VIEW

[w shoulder ap internal left]
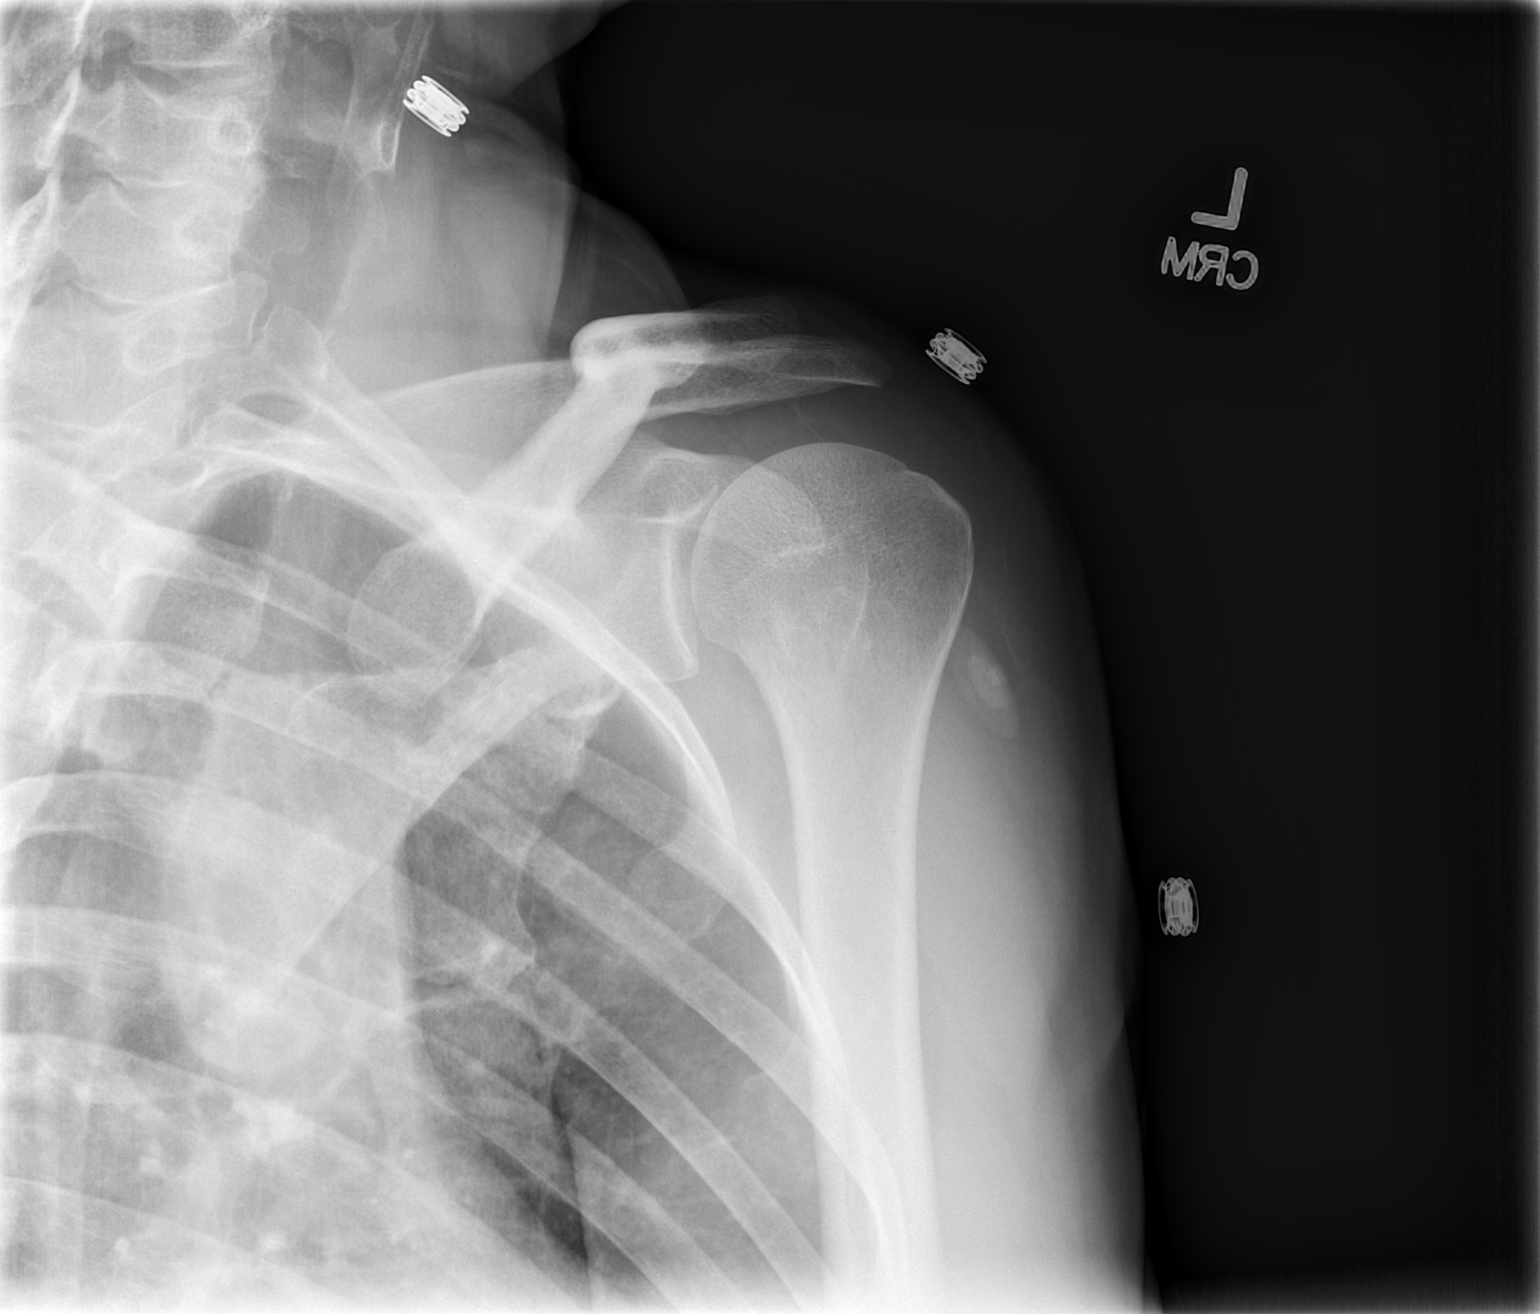

[w shoulder y view left]
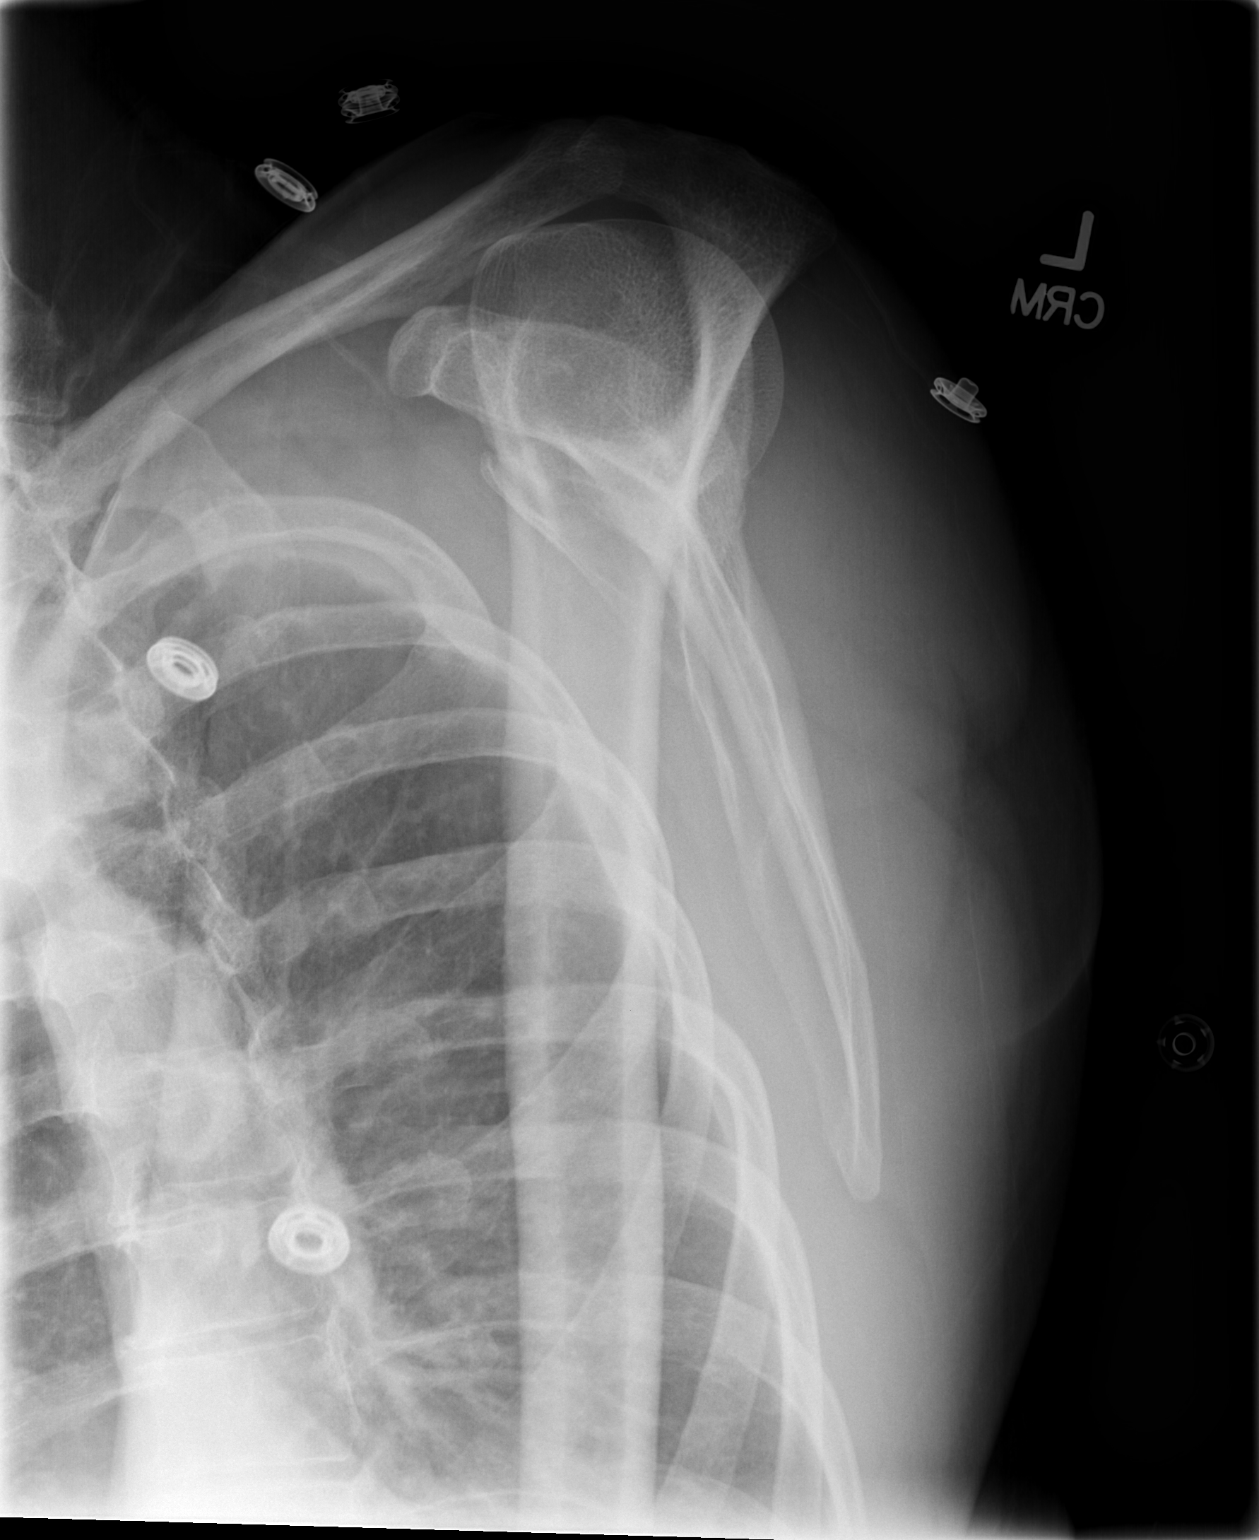

[x shoulder axillary left]
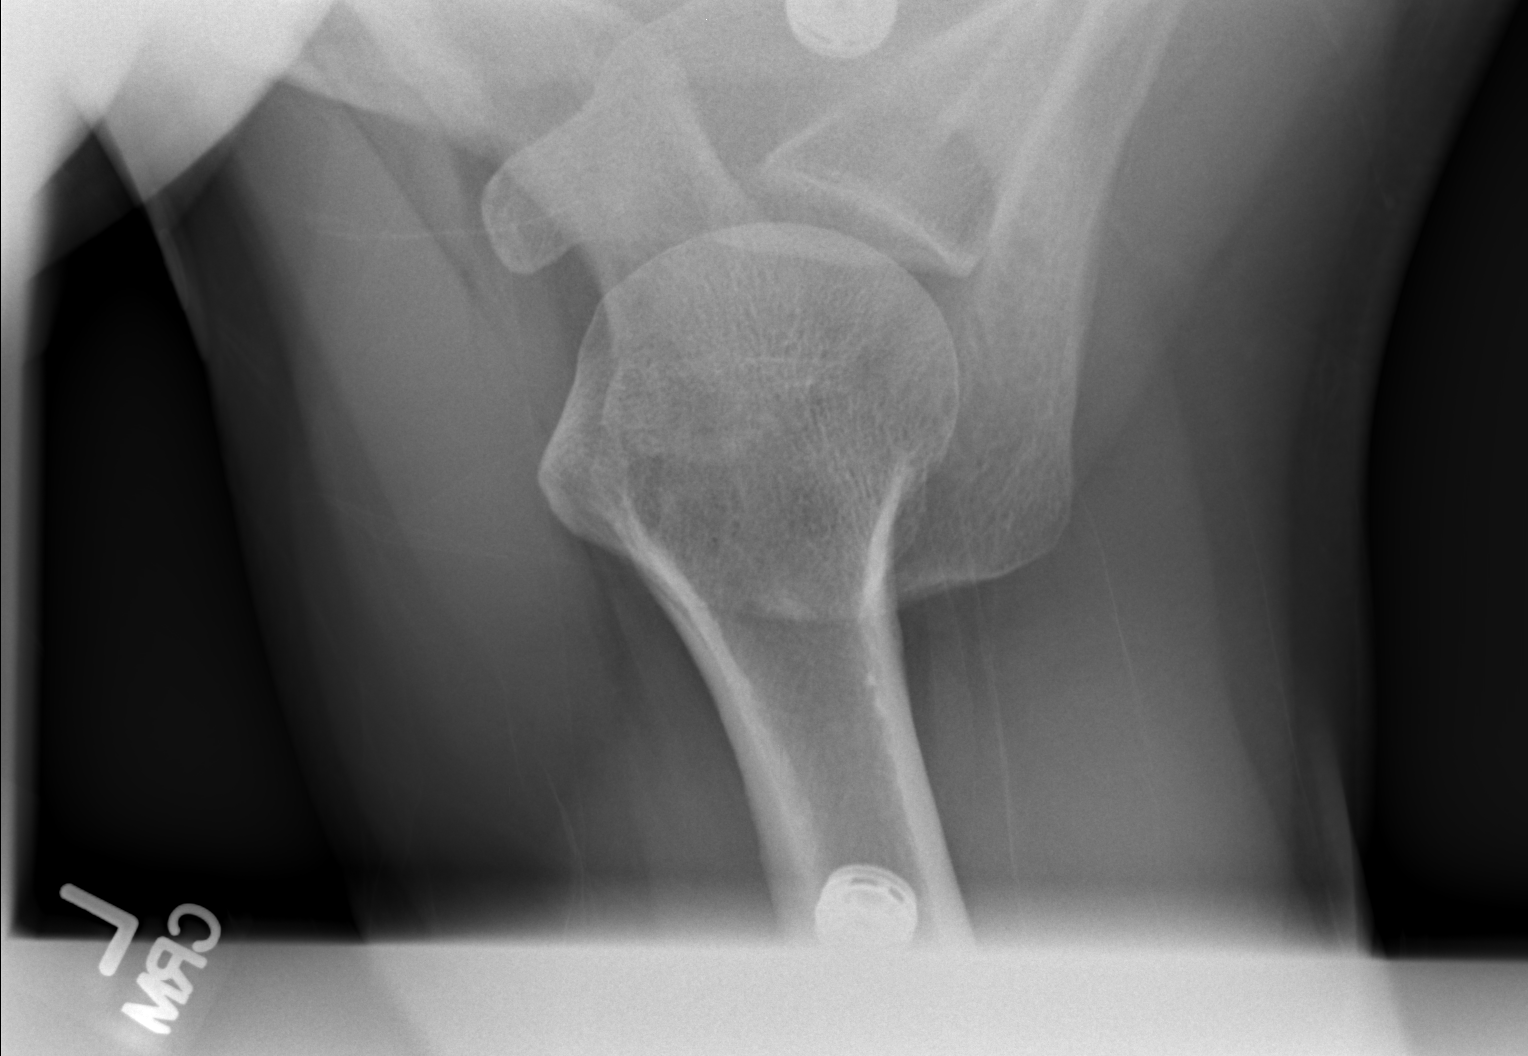

[3 of 3 positions shown; findings below may reference images not displayed]

FINDINGS: There is no evidence of fracture or dislocation. There is no
evidence of arthropathy or other focal bone abnormality. Soft
tissues are unremarkable.
IMPRESSION: Negative.
